# Patient Record
Sex: Female | Born: 1965 | Hispanic: Yes | Marital: Married | Smoking: Never smoker
Health system: Southern US, Community
[De-identification: ages and names within clinical notes are randomized; demographics above are authoritative.]

## PROBLEM LIST (undated history)

## (undated) DIAGNOSIS — E039 Hypothyroidism, unspecified: Secondary | ICD-10-CM

## (undated) DIAGNOSIS — R7303 Prediabetes: Secondary | ICD-10-CM

## (undated) DIAGNOSIS — L918 Other hypertrophic disorders of the skin: Secondary | ICD-10-CM

## (undated) HISTORY — PX: TUBAL LIGATION: SHX77

## (undated) HISTORY — DX: Other hypertrophic disorders of the skin: L91.8

## (undated) HISTORY — PX: VASCULAR SURGERY: SHX849

---

## 2001-01-31 HISTORY — PX: VASCULAR SURGERY: SHX849

## 2001-01-31 HISTORY — PX: VARICOSE VEIN SURGERY: SHX832

## 2004-12-28 ENCOUNTER — Ambulatory Visit: Payer: Self-pay | Admitting: Family Medicine

## 2006-09-20 ENCOUNTER — Ambulatory Visit: Payer: Self-pay | Admitting: Family Medicine

## 2007-11-20 ENCOUNTER — Ambulatory Visit: Payer: Self-pay | Admitting: Family Medicine

## 2009-05-07 ENCOUNTER — Ambulatory Visit: Payer: Self-pay | Admitting: Internal Medicine

## 2011-06-07 ENCOUNTER — Ambulatory Visit: Payer: Self-pay | Admitting: Family Medicine

## 2014-01-06 ENCOUNTER — Ambulatory Visit: Payer: Self-pay

## 2014-01-17 ENCOUNTER — Ambulatory Visit: Payer: Self-pay

## 2014-12-08 ENCOUNTER — Other Ambulatory Visit: Payer: Self-pay | Admitting: Primary Care

## 2014-12-08 DIAGNOSIS — Z0000 Encounter for general adult medical examination without abnormal findings: Secondary | ICD-10-CM

## 2015-01-28 ENCOUNTER — Encounter: Payer: Self-pay | Admitting: *Deleted

## 2015-01-28 ENCOUNTER — Ambulatory Visit: Payer: Self-pay

## 2015-01-28 ENCOUNTER — Ambulatory Visit
Admission: RE | Admit: 2015-01-28 | Discharge: 2015-01-28 | Disposition: A | Discharge status: Home / Self Care | Payer: Self-pay | Source: Ambulatory Visit | Attending: Oncology | Admitting: Oncology

## 2015-01-28 ENCOUNTER — Ambulatory Visit: Payer: Self-pay | Attending: Oncology | Admitting: *Deleted

## 2015-01-28 VITALS — BP 133/82 | HR 72 | Temp 98.0°F | Resp 18 | Ht 61.02 in | Wt 190.3 lb

## 2015-01-28 DIAGNOSIS — Z0000 Encounter for general adult medical examination without abnormal findings: Secondary | ICD-10-CM

## 2015-01-28 NOTE — Patient Instructions (Signed)
Gave patient hand-out, Women Staying Healthy, Active and Well from [facility], with education on breast health, pap smears, heart and colon health. 

## 2015-01-28 NOTE — Progress Notes (Signed)
Subjective:     Patient ID: Gabriela Lawrence, female   DOB: Nov 21, 1965, 49 y.o.   MRN: [id number]  HPI   Review of Systems     Objective:   Physical Exam  Pulmonary/Chest: Right breast exhibits no inverted nipple, no mass, no nipple discharge, no skin change and no tenderness. Left breast exhibits no inverted nipple, no mass, no nipple discharge, no skin change and no tenderness. Breasts are asymmetrical.  Left breast larger than the right       Assessment:     49 year old Hispanic [facility]  Maritza, the interpreter present during the interview and exam.  Clinical breast exam unremarkable.  Taught self breast awareness.  Patient has been screened for eligibility.  She does not have any insurance, Medicare or Medicaid.  She also meets financial eligibility.  Hand-out given on the Affordable Care Act.     Plan:     Screening mammogram ordered.  To follow-up per [facility] protocol.

## 2015-02-03 ENCOUNTER — Encounter: Payer: Self-pay | Admitting: *Deleted

## 2015-02-03 NOTE — Progress Notes (Signed)
Letter mailed to inform patient of her normal mammogram.  Next screening mammogram in one year.  [place]

## 2016-02-15 ENCOUNTER — Encounter: Payer: Self-pay | Admitting: *Deleted

## 2016-02-15 ENCOUNTER — Ambulatory Visit
Admission: RE | Admit: 2016-02-15 | Discharge: 2016-02-15 | Disposition: A | Discharge status: Home / Self Care | Payer: Self-pay | Source: Ambulatory Visit | Attending: Oncology | Admitting: Oncology

## 2016-02-15 ENCOUNTER — Ambulatory Visit: Payer: Self-pay | Attending: Oncology | Admitting: *Deleted

## 2016-02-15 VITALS — BP 125/78 | HR 59 | Temp 97.3°F | Resp 18 | Ht 60.63 in | Wt 191.9 lb

## 2016-02-15 DIAGNOSIS — Z0000 Encounter for general adult medical examination without abnormal findings: Secondary | ICD-10-CM

## 2016-02-15 NOTE — Patient Instructions (Signed)
Gave patient hand-out, Women Staying Healthy, Active and Well from [facility], with education on breast health, pap smears, heart and colon health. 

## 2016-02-15 NOTE — Progress Notes (Signed)
Subjective:     Patient ID: Gabriela Lawrence, female   DOB: 1965/10/13, 51 y.o.   MRN: [id number]  HPI   Review of Systems     Objective:   Physical Exam  Pulmonary/Chest: Right breast exhibits no inverted nipple, no mass, no nipple discharge, no skin change and no tenderness. Left breast exhibits no inverted nipple, no mass, no nipple discharge, no skin change and no tenderness. Breasts are symmetrical.       Assessment:     51 year old Hispanic [facility].  Gabriela Lawrence, the interpreter present during the interveiw and exam.  Clinical breast exam unremarkable.  Taught self breast awareness.  Patient has been screened for eligibility.  She does not have any insurance, Medicare or Medicaid.  She also meets financial eligibility.  Hand-out given on the Affordable Care Act.    Plan:     Screening mammogram ordered.  Will follow-up per [facility] protocol.

## 2016-02-15 NOTE — Progress Notes (Signed)
Letter mailed to inform patient of her normal mammogram.  She is to follow-up in one year with annual screening.  HSIS to [place]. 

## 2018-11-26 ENCOUNTER — Telehealth: Payer: Self-pay

## 2018-11-27 ENCOUNTER — Ambulatory Visit: Payer: Self-pay | Attending: Oncology | Admitting: *Deleted

## 2018-11-27 ENCOUNTER — Other Ambulatory Visit: Payer: Self-pay

## 2018-11-27 ENCOUNTER — Encounter: Payer: Self-pay | Admitting: *Deleted

## 2018-11-27 ENCOUNTER — Ambulatory Visit
Admission: RE | Admit: 2018-11-27 | Discharge: 2018-11-27 | Disposition: A | Discharge status: Home / Self Care | Payer: Self-pay | Source: Ambulatory Visit | Attending: Oncology | Admitting: Oncology

## 2018-11-27 VITALS — BP 116/68 | HR 71 | Temp 97.6°F | Ht 61.0 in | Wt 199.0 lb

## 2018-11-27 DIAGNOSIS — Z0000 Encounter for general adult medical examination without abnormal findings: Secondary | ICD-10-CM

## 2018-11-27 NOTE — Progress Notes (Signed)
  Subjective:     Patient ID: Gabriela Lawrence, female   DOB: Feb 08, 1965, 53 y.o.   MRN: [id number]  HPI   Review of Systems     Objective:   Physical Exam Chest:     Breasts:        Right: No swelling, bleeding, inverted nipple, mass, nipple discharge, skin change or tenderness.        Left: No swelling, bleeding, inverted nipple, mass, nipple discharge, skin change or tenderness.  Lymphadenopathy:     Upper Body:     Right upper body: No supraclavicular or axillary adenopathy.     Left upper body: No supraclavicular or axillary adenopathy.        Assessment:     53 year old Hispanic female present to [facility] for clinical breast exam and mammogram.  Lloyda, the interpreter present during the interview and exam. Clinical breast exam unremarkable.  Taught self breast awareness.  Tyrer-Cuzick breast cancer risk assessment with a lifetime risk of 14.1%.  Per NCCN guidelines no further imagaing or genetic testing is recommended.  Patient has been screened for eligibility.  She does not have any insurance, Medicare or Medicaid.  She also meets financial eligibility.  Hand-out given on the Affordable Care Act.    Plan:     Screening mammogram ordered.  Will follow up per [facility] protocol.

## 2018-11-27 NOTE — Patient Instructions (Signed)
Gave patient hand-out, Women Staying Healthy, Active and Well from [facility], with education on breast health, pap smears, heart and colon health. 

## 2018-11-28 ENCOUNTER — Encounter: Payer: Self-pay | Admitting: *Deleted

## 2018-11-28 NOTE — Progress Notes (Signed)
Letter mailed from [facility] to inform patient of her normal mammogram results.  Patient is to follow-up with annual screening in one year.  HSIS to [place]. 

## 2018-11-29 ENCOUNTER — Encounter: Payer: Self-pay | Admitting: *Deleted

## 2019-11-11 ENCOUNTER — Emergency Department: Payer: Self-pay

## 2019-11-11 ENCOUNTER — Other Ambulatory Visit: Payer: Self-pay

## 2019-11-11 DIAGNOSIS — K432 Incisional hernia without obstruction or gangrene: Secondary | ICD-10-CM | POA: Insufficient documentation

## 2019-11-11 DIAGNOSIS — Z20822 Contact with and (suspected) exposure to covid-19: Secondary | ICD-10-CM | POA: Insufficient documentation

## 2019-11-11 DIAGNOSIS — K353 Acute appendicitis with localized peritonitis, without perforation or gangrene: Principal | ICD-10-CM | POA: Insufficient documentation

## 2019-11-11 DIAGNOSIS — Z23 Encounter for immunization: Secondary | ICD-10-CM | POA: Insufficient documentation

## 2019-11-11 DIAGNOSIS — R911 Solitary pulmonary nodule: Secondary | ICD-10-CM | POA: Insufficient documentation

## 2019-11-11 LAB — CBC
HCT: 41.6 % (ref 36.0–46.0)
Hemoglobin: 14 g/dL (ref 12.0–15.0)
MCH: 30.2 pg (ref 26.0–34.0)
MCHC: 33.7 g/dL (ref 30.0–36.0)
MCV: 89.8 fL (ref 80.0–100.0)
Platelets: 234 10*3/uL (ref 150–400)
RBC: 4.63 MIL/uL (ref 3.87–5.11)
RDW: 12.6 % (ref 11.5–15.5)
WBC: 11.2 10*3/uL — ABNORMAL HIGH (ref 4.0–10.5)
nRBC: 0 % (ref 0.0–0.2)

## 2019-11-11 LAB — COMPREHENSIVE METABOLIC PANEL
ALT: 29 U/L (ref 0–44)
AST: 25 U/L (ref 15–41)
Albumin: 4.5 g/dL (ref 3.5–5.0)
Alkaline Phosphatase: 80 U/L (ref 38–126)
Anion gap: 11 (ref 5–15)
BUN: 19 mg/dL (ref 6–20)
CO2: 23 mmol/L (ref 22–32)
Calcium: 9.5 mg/dL (ref 8.9–10.3)
Chloride: 101 mmol/L (ref 98–111)
Creatinine, Ser: 0.6 mg/dL (ref 0.44–1.00)
GFR, Estimated: >60 mL/min (ref >60)
Glucose, Bld: 122 mg/dL — ABNORMAL HIGH (ref 70–99)
Potassium: 4.1 mmol/L (ref 3.5–5.1)
Sodium: 135 mmol/L (ref 135–145)
Total Bilirubin: 0.9 mg/dL (ref 0.3–1.2)
Total Protein: 7.7 g/dL (ref 6.5–8.1)

## 2019-11-11 LAB — URINALYSIS, COMPLETE (UACMP) WITH MICROSCOPIC
Bilirubin Urine: NEGATIVE
Glucose, UA: NEGATIVE mg/dL
Ketones, ur: NEGATIVE mg/dL
Nitrite: NEGATIVE
Protein, ur: NEGATIVE mg/dL
Specific Gravity, Urine: 1.019 (ref 1.005–1.030)
pH: 6 (ref 5.0–8.0)

## 2019-11-11 LAB — LIPASE, BLOOD: Lipase: 31 U/L (ref 11–51)

## 2019-11-11 LAB — PROTIME-INR
INR: 0.9 (ref 0.8–1.2)
Prothrombin Time: 11.9 seconds (ref 11.4–15.2)

## 2019-11-11 LAB — APTT: aPTT: 30 seconds (ref 24–36)

## 2019-11-11 LAB — POCT PREGNANCY, URINE: Preg Test, Ur: NEGATIVE

## 2019-11-11 NOTE — ED Notes (Addendum)
PT state L arm weakness, not pain. Mild drift noted upon exam. Lower extremities WDL, face symmetric.  PT also states hx of umbilical hernia

## 2019-11-11 NOTE — ED Triage Notes (Addendum)
PT to ED via POV from home c/o sharp abd pain (epigastric to umbilicus) since 1430 as well as  Arm pain (L), nausea, SHOB. No diarrhea or fevers. PT mildly diaphoretic  Triage using video interpretor

## 2019-11-12 ENCOUNTER — Emergency Department: Payer: Self-pay

## 2019-11-12 ENCOUNTER — Observation Stay: Payer: Self-pay | Admitting: Registered Nurse

## 2019-11-12 ENCOUNTER — Observation Stay
Admission: EM | Admit: 2019-11-12 | Discharge: 2019-11-13 | Disposition: A | Discharge status: Home / Self Care | Payer: Self-pay | Attending: Surgery | Admitting: Surgery

## 2019-11-12 ENCOUNTER — Encounter
Admission: EM | Disposition: A | Discharge status: Home / Self Care | Payer: Self-pay | Source: Home / Self Care | Attending: Emergency Medicine

## 2019-11-12 DIAGNOSIS — K353 Acute appendicitis with localized peritonitis, without perforation or gangrene: Secondary | ICD-10-CM

## 2019-11-12 DIAGNOSIS — K37 Unspecified appendicitis: Secondary | ICD-10-CM

## 2019-11-12 DIAGNOSIS — R911 Solitary pulmonary nodule: Secondary | ICD-10-CM

## 2019-11-12 DIAGNOSIS — K432 Incisional hernia without obstruction or gangrene: Secondary | ICD-10-CM

## 2019-11-12 DIAGNOSIS — K3580 Unspecified acute appendicitis: Secondary | ICD-10-CM | POA: Diagnosis present

## 2019-11-12 HISTORY — PX: LAPAROSCOPIC APPENDECTOMY: SHX408

## 2019-11-12 HISTORY — DX: Unspecified appendicitis: K37

## 2019-11-12 LAB — RESPIRATORY PANEL BY RT PCR (FLU A&B, COVID)
Influenza A by PCR: NEGATIVE
Influenza B by PCR: NEGATIVE
SARS Coronavirus 2 by RT PCR: NEGATIVE

## 2019-11-12 SURGERY — APPENDECTOMY, LAPAROSCOPIC
Anesthesia: General

## 2019-11-12 MED ORDER — LACTATED RINGERS IV SOLN: INTRAVENOUS | Status: DC | PRN

## 2019-11-12 MED ORDER — FENTANYL CITRATE (PF) 100 MCG/2ML IJ SOLN: 25.0000 ug | INTRAMUSCULAR | Status: DC | PRN

## 2019-11-12 MED ORDER — MORPHINE SULFATE (PF) 2 MG/ML IV SOLN
2.0000 mg | INTRAVENOUS | Status: DC | PRN
  Administered 2019-11-12: 2 mg via INTRAVENOUS
  Filled 2019-11-12: qty 1

## 2019-11-12 MED ORDER — OXYCODONE HCL 5 MG PO TABS: 5.0000 mg | ORAL_TABLET | Freq: Once | ORAL | Status: DC | PRN

## 2019-11-12 MED ORDER — SUCCINYLCHOLINE CHLORIDE 20 MG/ML IJ SOLN
INTRAMUSCULAR | Status: DC | PRN
  Administered 2019-11-12: 100 mg via INTRAVENOUS

## 2019-11-12 MED ORDER — INFLUENZA VAC SPLIT QUAD 0.5 ML IM SUSY
0.5000 mL | PREFILLED_SYRINGE | INTRAMUSCULAR | Status: AC
  Administered 2019-11-13: 0.5 mL via INTRAMUSCULAR
  Filled 2019-11-12: qty 0.5

## 2019-11-12 MED ORDER — FENTANYL CITRATE (PF) 100 MCG/2ML IJ SOLN
INTRAMUSCULAR | Status: DC | PRN
  Administered 2019-11-12 (×2): 25 ug via INTRAVENOUS
  Administered 2019-11-12: 50 ug via INTRAVENOUS

## 2019-11-12 MED ORDER — LIDOCAINE HCL (CARDIAC) PF 100 MG/5ML IV SOSY
PREFILLED_SYRINGE | INTRAVENOUS | Status: DC | PRN
  Administered 2019-11-12: 100 mg via INTRAVENOUS

## 2019-11-12 MED ORDER — ONDANSETRON HCL 4 MG/2ML IJ SOLN
INTRAMUSCULAR | Status: AC
  Filled 2019-11-12: qty 2

## 2019-11-12 MED ORDER — IOHEXOL 300 MG/ML  SOLN
100.0000 mL | Freq: Once | INTRAMUSCULAR | Status: AC | PRN
  Administered 2019-11-12: 100 mL via INTRAVENOUS

## 2019-11-12 MED ORDER — KETOROLAC TROMETHAMINE 30 MG/ML IJ SOLN
INTRAMUSCULAR | Status: DC | PRN
  Administered 2019-11-12: 30 mg via INTRAVENOUS

## 2019-11-12 MED ORDER — FENTANYL CITRATE (PF) 100 MCG/2ML IJ SOLN
50.0000 ug | Freq: Once | INTRAMUSCULAR | Status: AC
  Administered 2019-11-12: 50 ug via INTRAVENOUS
  Filled 2019-11-12: qty 2

## 2019-11-12 MED ORDER — PROMETHAZINE HCL 25 MG/ML IJ SOLN: 6.2500 mg | INTRAMUSCULAR | Status: DC | PRN

## 2019-11-12 MED ORDER — SODIUM CHLORIDE 0.9 % IV SOLN: INTRAVENOUS | Status: DC

## 2019-11-12 MED ORDER — ACETAMINOPHEN 10 MG/ML IV SOLN
INTRAVENOUS | Status: AC
  Filled 2019-11-12: qty 100

## 2019-11-12 MED ORDER — LACTATED RINGERS IV BOLUS
1000.0000 mL | Freq: Once | INTRAVENOUS | Status: AC
  Administered 2019-11-12: 1000 mL via INTRAVENOUS

## 2019-11-12 MED ORDER — ONDANSETRON HCL 4 MG/2ML IJ SOLN
4.0000 mg | Freq: Once | INTRAMUSCULAR | Status: AC
  Administered 2019-11-12: 4 mg via INTRAVENOUS
  Filled 2019-11-12: qty 2

## 2019-11-12 MED ORDER — OXYCODONE HCL 5 MG/5ML PO SOLN: 5.0000 mg | Freq: Once | ORAL | Status: DC | PRN

## 2019-11-12 MED ORDER — ONDANSETRON HCL 4 MG/2ML IJ SOLN
INTRAMUSCULAR | Status: DC | PRN
  Administered 2019-11-12: 4 mg via INTRAVENOUS

## 2019-11-12 MED ORDER — KETAMINE HCL 10 MG/ML IJ SOLN
INTRAMUSCULAR | Status: DC | PRN
  Administered 2019-11-12 (×2): 10 mg via INTRAVENOUS
  Administered 2019-11-12: 20 mg via INTRAVENOUS

## 2019-11-12 MED ORDER — KETOROLAC TROMETHAMINE 30 MG/ML IJ SOLN
INTRAMUSCULAR | Status: AC
  Filled 2019-11-12: qty 1

## 2019-11-12 MED ORDER — MIDAZOLAM HCL 2 MG/2ML IJ SOLN
INTRAMUSCULAR | Status: DC | PRN
  Administered 2019-11-12: 2 mg via INTRAVENOUS

## 2019-11-12 MED ORDER — BUPIVACAINE-EPINEPHRINE (PF) 0.5% -1:200000 IJ SOLN
INTRAMUSCULAR | Status: AC
  Filled 2019-11-12: qty 90

## 2019-11-12 MED ORDER — FENTANYL CITRATE (PF) 100 MCG/2ML IJ SOLN
INTRAMUSCULAR | Status: AC
  Filled 2019-11-12: qty 2

## 2019-11-12 MED ORDER — PHENYLEPHRINE HCL (PRESSORS) 10 MG/ML IV SOLN
INTRAVENOUS | Status: DC | PRN
  Administered 2019-11-12 (×2): 100 ug via INTRAVENOUS

## 2019-11-12 MED ORDER — BUPIVACAINE-EPINEPHRINE (PF) 0.5% -1:200000 IJ SOLN
INTRAMUSCULAR | Status: DC | PRN
  Administered 2019-11-12: 30 mL

## 2019-11-12 MED ORDER — OXYCODONE HCL 5 MG PO TABS: 5.0000 mg | ORAL_TABLET | ORAL | Status: DC | PRN

## 2019-11-12 MED ORDER — ONDANSETRON 4 MG PO TBDP: 4.0000 mg | ORAL_TABLET | Freq: Four times a day (QID) | ORAL | Status: DC | PRN

## 2019-11-12 MED ORDER — PROPOFOL 10 MG/ML IV BOLUS
INTRAVENOUS | Status: AC
  Filled 2019-11-12: qty 20

## 2019-11-12 MED ORDER — ROCURONIUM BROMIDE 100 MG/10ML IV SOLN
INTRAVENOUS | Status: DC | PRN
  Administered 2019-11-12: 40 mg via INTRAVENOUS
  Administered 2019-11-12: 10 mg via INTRAVENOUS

## 2019-11-12 MED ORDER — DEXAMETHASONE SODIUM PHOSPHATE 10 MG/ML IJ SOLN
INTRAMUSCULAR | Status: DC | PRN
  Administered 2019-11-12: 6 mg via INTRAVENOUS

## 2019-11-12 MED ORDER — MIDAZOLAM HCL 2 MG/2ML IJ SOLN
INTRAMUSCULAR | Status: AC
  Filled 2019-11-12: qty 2

## 2019-11-12 MED ORDER — MEPERIDINE HCL 50 MG/ML IJ SOLN: 6.2500 mg | INTRAMUSCULAR | Status: DC | PRN

## 2019-11-12 MED ORDER — PIPERACILLIN-TAZOBACTAM 3.375 G IVPB
3.3750 g | Freq: Three times a day (TID) | INTRAVENOUS | Status: DC
  Administered 2019-11-12 – 2019-11-13 (×3): 3.375 g via INTRAVENOUS
  Filled 2019-11-12 (×3): qty 50

## 2019-11-12 MED ORDER — SUGAMMADEX SODIUM 200 MG/2ML IV SOLN
INTRAVENOUS | Status: DC | PRN
  Administered 2019-11-12: 200 mg via INTRAVENOUS

## 2019-11-12 MED ORDER — SODIUM CHLORIDE (PF) 0.9 % IJ SOLN
INTRAMUSCULAR | Status: AC
  Filled 2019-11-12: qty 10

## 2019-11-12 MED ORDER — KETOROLAC TROMETHAMINE 30 MG/ML IJ SOLN
30.0000 mg | Freq: Four times a day (QID) | INTRAMUSCULAR | Status: DC
  Administered 2019-11-12 – 2019-11-13 (×4): 30 mg via INTRAVENOUS
  Filled 2019-11-12 (×4): qty 1

## 2019-11-12 MED ORDER — ONDANSETRON HCL 4 MG/2ML IJ SOLN
4.0000 mg | Freq: Four times a day (QID) | INTRAMUSCULAR | Status: DC | PRN
  Administered 2019-11-12: 4 mg via INTRAVENOUS
  Filled 2019-11-12: qty 2

## 2019-11-12 MED ORDER — HYDROMORPHONE HCL 1 MG/ML IJ SOLN: 0.5000 mg | INTRAMUSCULAR | Status: DC | PRN

## 2019-11-12 MED ORDER — PROPOFOL 10 MG/ML IV BOLUS
INTRAVENOUS | Status: DC | PRN
  Administered 2019-11-12: 160 mg via INTRAVENOUS

## 2019-11-12 MED ORDER — ACETAMINOPHEN 500 MG PO TABS
1000.0000 mg | ORAL_TABLET | Freq: Four times a day (QID) | ORAL | Status: DC
  Administered 2019-11-12 – 2019-11-13 (×5): 1000 mg via ORAL
  Filled 2019-11-12 (×5): qty 2

## 2019-11-12 MED ORDER — DEXAMETHASONE SODIUM PHOSPHATE 10 MG/ML IJ SOLN
INTRAMUSCULAR | Status: AC
  Filled 2019-11-12: qty 1

## 2019-11-12 SURGICAL SUPPLY — 39 items
CANISTER SUCT 1200ML W/VALVE (MISCELLANEOUS) ×3 IMPLANT
CHLORAPREP W/TINT 26 (MISCELLANEOUS) ×3 IMPLANT
COVER WAND RF STERILE (DRAPES) ×3 IMPLANT
CUTTER FLEX LINEAR 45M (STAPLE) ×3 IMPLANT
DERMABOND ADVANCED (GAUZE/BANDAGES/DRESSINGS) ×2
DERMABOND ADVANCED .7 DNX12 (GAUZE/BANDAGES/DRESSINGS) ×1 IMPLANT
ELECT CAUTERY BLADE 6.4 (BLADE) ×3 IMPLANT
ELECT REM PT RETURN 9FT ADLT (ELECTROSURGICAL) ×3
ELECTRODE REM PT RTRN 9FT ADLT (ELECTROSURGICAL) ×1 IMPLANT
GLOVE SURG SYN 7.0 (GLOVE) ×12 IMPLANT
GLOVE SURG SYN 7.5  E (GLOVE) ×8
GLOVE SURG SYN 7.5 E (GLOVE) ×4 IMPLANT
GOWN STRL REUS W/ TWL LRG LVL3 (GOWN DISPOSABLE) ×2 IMPLANT
GOWN STRL REUS W/TWL LRG LVL3 (GOWN DISPOSABLE) ×4
IRRIGATION STRYKERFLOW (MISCELLANEOUS) IMPLANT
IRRIGATOR STRYKERFLOW (MISCELLANEOUS)
IV NS 1000ML (IV SOLUTION) ×2
IV NS 1000ML BAXH (IV SOLUTION) ×1 IMPLANT
KIT TURNOVER KIT A (KITS) ×3 IMPLANT
LABEL OR SOLS (LABEL) ×3 IMPLANT
LIGASURE LAP MARYLAND 5MM 37CM (ELECTROSURGICAL) ×3 IMPLANT
NEEDLE HYPO 22GX1.5 SAFETY (NEEDLE) ×3 IMPLANT
NS IRRIG 500ML POUR BTL (IV SOLUTION) ×3 IMPLANT
PACK LAP CHOLECYSTECTOMY (MISCELLANEOUS) ×3 IMPLANT
PENCIL ELECTRO HAND CTR (MISCELLANEOUS) ×3 IMPLANT
POUCH SPECIMEN RETRIEVAL 10MM (ENDOMECHANICALS) ×3 IMPLANT
RELOAD 45 VASCULAR/THIN (ENDOMECHANICALS) IMPLANT
RELOAD STAPLE TA45 3.5 REG BLU (ENDOMECHANICALS) ×3 IMPLANT
SCISSORS METZENBAUM CVD 33 (INSTRUMENTS) ×3 IMPLANT
SLEEVE ADV FIXATION 5X100MM (TROCAR) ×3 IMPLANT
SUT MNCRL 4-0 (SUTURE) ×2
SUT MNCRL 4-0 27XMFL (SUTURE) ×1
SUT VICRYL 0 AB UR-6 (SUTURE) ×3 IMPLANT
SUTURE MNCRL 4-0 27XMF (SUTURE) ×1 IMPLANT
SYS KII FIOS ACCESS ABD 5X100 (TROCAR) ×3
SYSTEM KII FIOS ACES ABD 5X100 (TROCAR) ×1 IMPLANT
TRAY FOLEY MTR SLVR 16FR STAT (SET/KITS/TRAYS/PACK) ×3 IMPLANT
TROCAR BALLN GELPORT 12X130M (ENDOMECHANICALS) ×3 IMPLANT
TUBING EVAC SMOKE HEATED PNEUM (TUBING) ×3 IMPLANT

## 2019-11-12 NOTE — Anesthesia Preprocedure Evaluation (Signed)
Anesthesia Evaluation  Patient identified by MRN, date of birth, ID band Patient awake    Reviewed: Allergy & Precautions, NPO status , Patient's Chart, lab work & pertinent test results  History of Anesthesia Complications Negative for: history of anesthetic complications  Airway Mallampati: IV  TM Distance: >3 FB Neck ROM: Full    Dental no notable dental hx.    Pulmonary neg pulmonary ROS, neg sleep apnea, neg COPD,    breath sounds clear to auscultation- rhonchi (-) wheezing      Cardiovascular Exercise Tolerance: Good (-) hypertension(-) CAD and (-) Past MI  Rhythm:Regular Rate:Normal - Systolic murmurs and - Diastolic murmurs    Neuro/Psych negative neurological ROS  negative psych ROS   GI/Hepatic negative GI ROS, Neg liver ROS,   Endo/Other  negative endocrine ROSneg diabetes  Renal/GU negative Renal ROS     Musculoskeletal negative musculoskeletal ROS (+)   Abdominal (+) + obese,   Peds  Hematology negative hematology ROS (+)   Anesthesia Other Findings   Reproductive/Obstetrics                             Anesthesia Physical Anesthesia Plan  ASA: II and emergent  Anesthesia Plan: General   Post-op Pain Management:    Induction: Intravenous  PONV Risk Score and Plan: 2 and Ondansetron, Dexamethasone and Midazolam  Airway Management Planned: Oral ETT  Additional Equipment:   Intra-op Plan:   Post-operative Plan: Extubation in OR  Informed Consent: I have reviewed the patients History and Physical, chart, labs and discussed the procedure including the risks, benefits and alternatives for the proposed anesthesia with the patient or authorized representative who has indicated his/her understanding and acceptance.     Dental advisory given  Plan Discussed with: CRNA and Anesthesiologist  Anesthesia Plan Comments:         Anesthesia Quick Evaluation

## 2019-11-12 NOTE — Anesthesia Procedure Notes (Signed)
Procedure Name: Intubation Date/Time: 11/12/2019 2:48 PM Performed by: Lynden Oxford, CRNA Pre-anesthesia Checklist: Patient identified, Emergency Drugs available, Suction available and Patient being monitored Patient Re-evaluated:Patient Re-evaluated prior to induction Oxygen Delivery Method: Circle system utilized Preoxygenation: Pre-oxygenation with 100% oxygen Induction Type: IV induction, Rapid sequence and Cricoid Pressure applied Laryngoscope Size: McGraph and 3 Grade View: Grade I Tube type: Oral Tube size: 7.0 mm Number of attempts: 1 Airway Equipment and Method: Stylet,  Oral airway and Video-laryngoscopy Placement Confirmation: ETT inserted through vocal cords under direct vision,  positive ETCO2 and breath sounds checked- equal and bilateral Secured at: 21 cm Tube secured with: Tape Dental Injury: Teeth and Oropharynx as per pre-operative assessment

## 2019-11-12 NOTE — ED Notes (Signed)
This tech explained to patient's husband that he is not allow to stay in the waiting area with the patient due to covid restriction. Patient's husband replied that, " she has been here since last night and that she is in pain. And I am just here to check on her. " This tech okayed for the husband to check on her but that he could not stay here longer than 5 minutes. This tech also apologized for the long wait and informed them that we are trying our best for her to go to a room and to be seen by a provider. Victorino Dike, RN will be notified.

## 2019-11-12 NOTE — ED Provider Notes (Signed)
[facility] Emergency Department Provider Note  ____________________________________________   First MD Initiated Contact with Patient 11/12/19 [phone]     (approximate)  I have reviewed the triage vital signs and the nursing notes.   HISTORY  Chief Complaint Abdominal Pain   HPI Gabriela Lawrence is a 54 y.o. female with past medical history of umbilical hernia and prior C-section who presents for assessment of generalized abdominal pain associate with some nonbloody nonbilious emesis that began yesterday.  Patient states this is associate with mild headache and some weakness in the left side of her body.  No personal episodes.  No clear alleviating aggravating factors.  Patient denies any vision changes, chest pain, cough, shortness of breath, urinary symptoms, diarrhea, constipation, blood in her stool, in her urine, back pain, rash, extremity pain or numbness, or other acute complaints.  Denies any other significant past medical history.  Does not take any daily medicines.  Denies EtOH or illicit drug use.         History reviewed. No pertinent past medical history.  There are no problems to display for this patient.   Past Surgical History:  Procedure Laterality Date  . VASCULAR SURGERY      Prior to Admission medications   Not on File    Allergies Patient has no known allergies.  Family History  Problem Relation Age of Onset  . Breast cancer Maternal Aunt 71    Social History Social History   Tobacco Use  . Smoking status: Never Smoker  . Smokeless tobacco: Never Used  Substance Use Topics  . Alcohol use: Never  . Drug use: Not on file    Review of Systems  Review of Systems  Constitutional: Negative for chills and fever.  HENT: Negative for sore throat.   Eyes: Negative for pain.  Respiratory: Negative for cough and stridor.   Cardiovascular: Negative for chest pain.  Gastrointestinal: Positive for abdominal pain,  nausea and vomiting. Negative for blood in stool and melena.  Genitourinary: Negative for dysuria.  Musculoskeletal: Negative for myalgias.  Skin: Negative for rash.  Neurological: Positive for weakness and headaches. Negative for seizures and loss of consciousness.  Psychiatric/Behavioral: Negative for suicidal ideas.  All other systems reviewed and are negative.     ____________________________________________   PHYSICAL EXAM:  VITAL SIGNS: ED Triage Vitals  Enc Vitals Group     BP 11/11/19 2222 (!) 166/113     Pulse Rate 11/11/19 2222 96     Resp 11/11/19 2222 20     Temp 11/12/19 0826 99 F (37.2 C)     Temp Source 11/12/19 0826 Oral     SpO2 11/11/19 2222 97 %     Weight 11/11/19 2223 190 lb (86.2 kg)     Height 11/11/19 2223 5' 1.02" (1.55 m)     Head Circumference --      Peak Flow --      Pain Score 11/11/19 2220 10     Pain Loc --      Pain Edu? --      Excl. in GC? --    Vitals:   11/12/19 0527 11/12/19 0826  BP: 102/80 (!) 146/80  Pulse: 98 94  Resp: 20 20  Temp:  99 F (37.2 C)  SpO2: 97% 99%   Physical Exam Vitals and nursing note reviewed.  Constitutional:      General: She is not in acute distress.    Appearance: She is well-developed.  HENT:  Head: Normocephalic and atraumatic.     Right Ear: External ear normal.     Left Ear: External ear normal.     Nose: Nose normal.     Mouth/Throat:     Mouth: Mucous membranes are moist.  Eyes:     Conjunctiva/sclera: Conjunctivae normal.  Cardiovascular:     Rate and Rhythm: Normal rate and regular rhythm.     Pulses: Normal pulses.     Heart sounds: No murmur heard.   Pulmonary:     Effort: Pulmonary effort is normal. No respiratory distress.     Breath sounds: Normal breath sounds.  Abdominal:     Palpations: Abdomen is soft.     Tenderness: There is generalized abdominal tenderness and tenderness in the right lower quadrant and suprapubic area. There is no right CVA tenderness or left CVA  tenderness.  Musculoskeletal:     Cervical back: Neck supple.  Skin:    General: Skin is warm and dry.     Capillary Refill: Capillary refill takes less than 2 seconds.  Neurological:     Mental Status: She is alert and oriented to person, place, and time.  Psychiatric:        Mood and Affect: Mood normal.     No pronator drift.  No finger dysmetria.  Cranial nerves II through XII grossly intact.  Patient has full and symmetric strength of all extremities.  Sensation is intact to light touch of the lower extremities. ____________________________________________   LABS (all labs ordered are listed, but only abnormal results are displayed)  Labs Reviewed  COMPREHENSIVE METABOLIC PANEL - Abnormal; Notable for the following components:      Result Value   Glucose, Bld 122 (*)    All other components within normal limits  CBC - Abnormal; Notable for the following components:   WBC 11.2 (*)    All other components within normal limits  URINALYSIS, COMPLETE (UACMP) WITH MICROSCOPIC - Abnormal; Notable for the following components:   Color, Urine YELLOW (*)    APPearance CLEAR (*)    Hgb urine dipstick MODERATE (*)    Leukocytes,Ua SMALL (*)    Bacteria, UA RARE (*)    All other components within normal limits  RESPIRATORY PANEL BY RT PCR (FLU A&B, COVID)  LIPASE, BLOOD  PROTIME-INR  APTT  POC URINE PREG, ED  POCT PREGNANCY, URINE   ____________________________________________  EKG  Sinus rhythm with a ventricular rate of 88, normal axis, unremarkable intervals, no clear evidence of acute ischemia although there is a nonspecific ST change in lead III. ____________________________________________  RADIOLOGY  CT abdomen pelvis interpreted by myself shows no evidence of SBO, diverticulitis, pyelonephritis but did show evidence of acute uncomplicated appendicitis.  CT head shows evidence of intracranial bleeding or other acute intracranial abnormality.  Official radiology  report(s): CT HEAD WO CONTRAST  Result Date: 11/11/2019 CLINICAL DATA:  Neuro deficit, acute, stroke suspected L arm weakness EXAM: CT HEAD WITHOUT CONTRAST TECHNIQUE: Contiguous axial images were obtained from the base of the skull through the vertex without intravenous contrast. COMPARISON:  None. FINDINGS: Brain: No evidence of large-territorial acute infarction. No parenchymal hemorrhage. No mass lesion. No extra-axial collection. No mass effect or midline shift. No hydrocephalus. Basilar cisterns are patent. Vascular: No hyperdense vessel. Skull: No acute fracture or focal lesion. Sinuses/Orbits: Paranasal sinuses and mastoid air cells are clear. The orbits are unremarkable. Other: None. IMPRESSION: No acute intracranial abnormality. Electronically Signed   By: Normajean GlasgowMorgane  Naveau M.D.  On: 11/11/2019 22:52   CT ABDOMEN PELVIS W CONTRAST  Result Date: 11/12/2019 CLINICAL DATA:  Abdominal pain with nausea EXAM: CT ABDOMEN AND PELVIS WITH CONTRAST TECHNIQUE: Multidetector CT imaging of the abdomen and pelvis was performed using the standard protocol following bolus administration of intravenous contrast. CONTRAST:  OMNIPAQUE IOHEXOL 300 MG/ML  SOLN COMPARISON:  None. FINDINGS: Lower chest: There is bibasilar atelectasis. There is a nodular opacity in the anterior left base measuring 0.9 x 0.4 cm seen on axial slice 6 series 7. Hepatobiliary: There is hepatic steatosis with fatty sparing near the gallbladder fossa region. No focal liver lesions are appreciable. The gallbladder wall is not appreciably thickened. There is no biliary duct dilatation. Pancreas: There is no pancreatic mass or inflammatory focus. Spleen: No splenic lesions are evident. Adrenals/Urinary Tract: Adrenals bilaterally appear normal. Kidneys bilaterally show no evident mass or hydronephrosis on either side. There is no appreciable renal or ureteral calculus on either side. Urinary bladder is midline with wall thickness within  normal limits. Stomach/Bowel: There is moderate stool in the colon. There is no appreciable bowel wall thickening. No bowel obstruction. The terminal ileum appears normal. No free air or portal venous air is evident. Vascular/Lymphatic: There is no abdominal aortic aneurysm. No appreciable arterial vascular lesions are evident. Major venous structures appear patent. There is an apparent lymph node slightly superior to the umbilicus anteriorly measuring 1.5 x 1.4 cm. There is a lymph node in the right inguinal region measuring 2.1 x 1.3 cm. Several smaller inguinal lymph nodes are noted on the right as well. No other adenopathy evident. Reproductive: The uterus is anteverted.  No adnexal mass. Other: The appendix measures 1.6 cm in diameter. There is moderate periappendiceal region soft tissue stranding and mild fluid. There are several appendicoliths within the appendix with an appendicular lith at the base of the appendix measuring 8 x 8 mm. There is no abscess or evidence of perforation. No abscess or ascites is evident in the abdomen or pelvis. There is mild fat in the umbilicus. Musculoskeletal: There is degenerative change in the lumbar spine. No blastic or lytic bone lesions are evident. No intramuscular lesions are apparent. IMPRESSION: 1.  Findings indicative of acute appendiceal inflammation. Appendix: Location: Appendix arises from the cecum extending inferomedially, located in the mid pelvic region to the right of midline. Diameter: 1.6 cm Appendicolith: Several, largest measuring 8 x 8 mm Mucosal hyper-enhancement: Equivocal Extraluminal gas: None Periappendiceal collection: Soft tissue stranding along the course of the appendix and nearby fat structures noted. Slight fluid noted without well-defined abscess. 2. Right inguinal adenopathy as well as enlarged lymph nodes slightly superior to the umbilicus of uncertain etiology. No other adenopathy. 3. No abscess in the abdomen or pelvis. No bowel  obstruction. No free air. 4. Hepatic steatosis with fatty sparing noted near the gallbladder fossa. 5. No renal or ureteral calculus. No hydronephrosis. Urinary bladder wall thickness normal. 6. 9 x 4 mm nodular opacity anterior left base. Non-contrast chest CT at 6-12 months is recommended. If the nodule is stable at time of repeat CT, then future CT at 18-24 months (from today's scan) is considered optional for low-risk patients, but is recommended for high-risk patients. This recommendation follows the consensus statement: Guidelines for Management of Incidental Pulmonary Nodules Detected on CT Images: From the Fleischner Society 2017; Radiology 2017; 284:228-243. Critical Value/emergent results were called by telephone at the time of interpretation on 11/12/2019 at 10:00 am to provider [facility] , who verbally acknowledged  these results. Electronically Signed   By: Bretta Bang III M.D.   On: 11/12/2019 10:01    ____________________________________________   PROCEDURES  Procedure(s) performed (including Critical Care):  .1-3 Lead EKG Interpretation Performed by: Gilles Chiquito, MD Authorized by: Gilles Chiquito, MD     Interpretation: normal     ECG rate assessment: normal     Rhythm: sinus rhythm     Ectopy: none     Conduction: normal       ____________________________________________   INITIAL IMPRESSION / ASSESSMENT AND PLAN / ED COURSE        Patient presents for assessment of some abdominal pain that is generalized for use of nonbloody nonbilious emesis, headache, and reported left-sided weakness that began yesterday.  States she thinks it began shortly after noon yesterday.  Patient is slightly hypertensive with a BP of 166/113 with otherwise stable vital signs on arrival.  Exam as above remarkable for no evidence of any focal neurological deficits and specifically no pronator drift although this was apparently seen in triage.  Patient was also noted to ambulate  with steady gait.  With regard to patient's abdominal pain differential includes but is not limited to diverticulitis, SBO, appendicitis, internal hernia, pancreatitis, cholelithiasis, pyelonephritis, appendicitis.  With regard to patient's headache and reported left-sided weakness while she was noted to be slightly hypertensive in triage on my assessment her BP is 146/80 and she has a completely nonfocal neurological exam.  CT head was obtained in triage is unremarkable for evidence of CVA or other acute intracranial abnormality.  Unclear the etiology for the symptoms will have a low suspicion for CVA or central acute infectious process at this time.  CT remarkable for evidence of acute uncomplicated appendicitis without evidence of clear abscess.  No evidence of SBO, diverticulitis, cholelithiasis, pyelonephritis.  Patient's urine does not appear infected.  Lipase is not consistent with acute appendicitis.  Patient is not appear septic at this time although she was given IV fluids and analgesia as noted below.  Given findings for acute appendicitis general surgery service/Dr. Aleen Campi was consulted.   Patient admitted in stable condition for further evaluation management.  ____________________________________________   FINAL CLINICAL IMPRESSION(S) / ED DIAGNOSES  Final diagnoses:  Acute appendicitis with localized peritonitis, without perforation, abscess, or gangrene  Lung nodule    Medications  lactated ringers bolus 1,000 mL (1,000 mLs Intravenous New Bag/Given 11/12/19 0911)  fentaNYL (SUBLIMAZE) injection 50 mcg (50 mcg Intravenous Given 11/12/19 0909)  ondansetron (ZOFRAN) injection 4 mg (4 mg Intravenous Given 11/12/19 0908)  iohexol (OMNIPAQUE) 300 MG/ML solution 100 mL (100 mLs Intravenous Contrast Given 11/12/19 0962)     ED Discharge Orders    None       Note:  This document was prepared using Dragon voice recognition software and may include unintentional dictation  errors.   Gilles Chiquito, MD 11/12/19 1009

## 2019-11-12 NOTE — Transfer of Care (Signed)
Immediate Anesthesia Transfer of Care Note  Patient: Creola Delia Rivera-Reyes  Procedure(s) Performed: APPENDECTOMY LAPAROSCOPIC (N/A )  Patient Location: PACU  Anesthesia Type:General  Level of Consciousness: drowsy  Airway & Oxygen Therapy: Patient Spontanous Breathing and Patient connected to face mask oxygen  Post-op Assessment: Report given to RN and Post -op Vital signs reviewed and stable  Post vital signs: Reviewed and stable  Last Vitals:  Vitals Value Taken Time  BP 110/55 11/12/19 1650  Temp 37.8 C 11/12/19 1650  Pulse 93 11/12/19 1652  Resp 15 11/12/19 1652  SpO2 99 % 11/12/19 1652  Vitals shown include unvalidated device data.  Last Pain:  Vitals:   11/12/19 1650  TempSrc:   PainSc: (P) Asleep         Complications: No complications documented.

## 2019-11-12 NOTE — ED Notes (Signed)
Pt assisted to the bathroom. Pt was able to urinate. Pt c/o a lot of pain while ambulating. Pt assisted back to bed and placed back on monitor. Colin Mulders, RN informed or pt pain while ambulating.

## 2019-11-12 NOTE — Op Note (Addendum)
  Procedure Date:  11/12/2019  Pre-operative Diagnosis:  Acute appendicitis and incisional umbilical hernia  Post-operative Diagnosis:  Acute appendicitis and incisional umbilical hernia  Procedure:  Laparoscopic appendectomy and open umbilical hernia repair  Surgeon:  Howie Ill, MD  Anesthesia:  General endotracheal  Estimated Blood Loss:  10 ml  Specimens:  appendix  Complications:  None  Indications for Procedure:  This is a 54 y.o. female who presents with abdominal pain and workup revealing acute appendicitis.  She also has a known incisional umbilical hernia from prior laparoscopic tubal ligation.  The options of surgery versus observation were reviewed with the patient and/or family. The risks of bleeding, infection, recurrence of symptoms, negative laparoscopy, potential for an open procedure, bowel injury, abscess or infection, were all discussed with the patient and she was willing to proceed.  Description of Procedure: The patient was correctly identified in the preoperative area and brought into the operating room.  The patient was placed supine with VTE prophylaxis in place.  Appropriate time-outs were performed.  Anesthesia was induced and the patient was intubated.  Foley catheter was placed.  Appropriate antibiotics were infused.  The abdomen was prepped and draped in a sterile fashion. An infraumbilical incision was made. Cautery was used to dissect down the subcutaneous tissue and to free up the umbilical stalk.  The patient had a small hernia defect and preperitoneal fat was protruding.  This was excised.  The hernia defect was extended in size to fit a Hasson trocar.  The fascial edges were cleaned up and a Hasson trocar was inserted.  Pneumoperitoneum was obtained with appropriate opening pressures.  Two 5-mm ports were placed in the suprapubic and left lateral positions under direct visualization.  The right lower quadrant was inspected and the appendix was  identified and found to be acutely inflamed and gangrenous but not perforated.  The patient had some ascitic fluid in the pelvis.  The appendix was carefully dissected.  The mesoappendix was divided using the LigaSure.  The base of the appendix was dissected out and divided with a standard load Endo GIA.  The appendix was placed in an Endocatch bag.  The right lower quadrant was then inspected again revealing an intact staple line, no bleeding, and no bowel injury.  The area was thoroughly irrigated.  The 5 mm ports were removed under direct visualization and the Hasson trocar was removed.  The Endocatch bag was brought out through the umbilical incision.  The incisional hernia was closed using 0 Vicryl sutures.  The stalk was reattached using 2-0 Vicryl.  Local anesthetic was infused in all incisions.  The umbilical incision was closed using 3-0 Vicryl and 4-0 Monocryl.  The other port incisions were closed with 4-0 Monocryl.  The wounds were cleaned and sealed with DermaBond.  Foley catheter was removed and the patient was emerged from anesthesia and extubated and brought to the recovery room for further management.  The patient tolerated the procedure well and all counts were correct at the end of the case.   Howie Ill, MD

## 2019-11-12 NOTE — H&P (Signed)
[facility] SURGICAL HISTORY & PHYSICAL (cpt [phone])  HISTORY OF PRESENT ILLNESS (HPI):  54 y.o. female presented to [facility] ED today for abdominal. Patient reports the onset of abdominal pain around yesterday afternoon. This pain was located near her umbilicus and was described as a sharp pain. She noted associated nausea and non-bloody non-bilious emesis. Interestingly, she had also noted left arm weakness/numbness in this timeframe as well. No fever, chills, cough, CP, SOB, bowel changes, urinary changes, or any additional neurological symptoms. She did not get any relief from her pain. Only previous surgical history is c-section. Work up in the ED revealed a leukocytosis to 11.2K, renal function was normal. CT Head was without any intracranial abnormalities. CT Abdomen/Pelvis was concerning for acute uncomplicated appendicitis.   General surgery is consulted by emergency medicine physician Dr Antoine Primas, MD for evaluation and management of acute appendicitis.    PAST MEDICAL HISTORY (PMH):  History reviewed. No pertinent past medical history.  Reviewed. Otherwise negative.   PAST SURGICAL HISTORY (PSH):  Past Surgical History:  Procedure Laterality Date  . VASCULAR SURGERY      Reviewed. Otherwise negative.   MEDICATIONS:  Prior to Admission medications   Not on File     ALLERGIES:  No Known Allergies   SOCIAL HISTORY:  Social History   Socioeconomic History  . Marital status: Married    Spouse name: Not on file  . Number of children: Not on file  . Years of education: Not on file  . Highest education level: Not on file  Occupational History  . Not on file  Tobacco Use  . Smoking status: Never Smoker  . Smokeless tobacco: Never Used  Substance and Sexual Activity  . Alcohol use: Never  . Drug use: Not on file  . Sexual activity: Not on file  Other Topics Concern  . Not on file  Social History Narrative  . Not on file   Social Determinants of  Health   Financial Resource Strain:   . Difficulty of Paying Living Expenses: Not on file  Food Insecurity:   . Worried About Programme researcher, broadcasting/film/video in the Last Year: Not on file  . Ran Out of Food in the Last Year: Not on file  Transportation Needs:   . Lack of Transportation (Medical): Not on file  . Lack of Transportation (Non-Medical): Not on file  Physical Activity:   . Days of Exercise per Week: Not on file  . Minutes of Exercise per Session: Not on file  Stress:   . Feeling of Stress : Not on file  Social Connections:   . Frequency of Communication with Friends and Family: Not on file  . Frequency of Social Gatherings with Friends and Family: Not on file  . Attends Religious Services: Not on file  . Active Member of Clubs or Organizations: Not on file  . Attends Banker Meetings: Not on file  . Marital Status: Not on file  Intimate Partner Violence:   . Fear of Current or Ex-Partner: Not on file  . Emotionally Abused: Not on file  . Physically Abused: Not on file  . Sexually Abused: Not on file     FAMILY HISTORY:  Family History  Problem Relation Age of Onset  . Breast cancer Maternal Aunt 60    Otherwise negative.   REVIEW OF SYSTEMS:  Review of Systems  Constitutional: Negative for chills and fever.  HENT: Negative for congestion and sore throat.   Respiratory: Negative  for cough and shortness of breath.   Cardiovascular: Negative for chest pain and palpitations.  Gastrointestinal: Positive for abdominal pain, nausea and vomiting. Negative for blood in stool, constipation and diarrhea.  Genitourinary: Negative for dysuria and urgency.  All other systems reviewed and are negative.   VITAL SIGNS:  Temp:  [99 F (37.2 C)] 99 F (37.2 C) (10/12 0826) Pulse Rate:  [84-98] 94 (10/12 0826) Resp:  [18-20] 20 (10/12 0826) BP: (102-166)/(74-113) 146/80 (10/12 0826) SpO2:  [97 %-99 %] 99 % (10/12 0826) Weight:  [86.2 kg] 86.2 kg (10/11 2223)     Height:  5' 1.02" (155 cm) Weight: 86.2 kg BMI (Calculated): 35.87   PHYSICAL EXAM:  Physical Exam Vitals and nursing note reviewed. Exam conducted with a chaperone present.  Constitutional:      General: She is not in acute distress.    Appearance: She is well-developed. She is obese. She is not ill-appearing.  HENT:     Head: Normocephalic and atraumatic.  Eyes:     General: No scleral icterus.    Extraocular Movements: Extraocular movements intact.  Cardiovascular:     Rate and Rhythm: Normal rate and regular rhythm.     Heart sounds: Normal heart sounds. No murmur heard.   Pulmonary:     Effort: Pulmonary effort is normal. No respiratory distress.     Breath sounds: Normal breath sounds.  Abdominal:     General: A surgical scar is present. There is no distension.     Palpations: Abdomen is soft.     Tenderness: There is abdominal tenderness in the right lower quadrant, periumbilical area and suprapubic area. There is no guarding or rebound. Positive signs include McBurney's sign. Negative signs include Murphy's sign.  Genitourinary:    Comments: Deferred Skin:    General: Skin is warm and dry.     Coloration: Skin is not jaundiced or pale.  Neurological:     General: No focal deficit present.     Mental Status: She is alert and oriented to person, place, and time.  Psychiatric:        Mood and Affect: Mood normal.        Behavior: Behavior normal.     INTAKE/OUTPUT:  This shift: No intake/output data recorded.  Last 2 shifts: @IOLAST2SHIFTS @  Labs:  CBC Latest Ref Rng & Units 11/11/2019  WBC 4.0 - 10.5 K/uL 11.2(H)  Hemoglobin 12.0 - 15.0 g/dL 01/11/2020  Hematocrit 36 - 46 % 41.6  Platelets 150 - 400 K/uL 234   CMP Latest Ref Rng & Units 11/11/2019  Glucose 70 - 99 mg/dL 01/11/2020)  BUN 6 - 20 mg/dL 19  Creatinine 740(C - 1.44 mg/dL 8.18  Sodium 5.63 - 149 mmol/L 135  Potassium 3.5 - 5.1 mmol/L 4.1  Chloride 98 - 111 mmol/L 101  CO2 22 - 32 mmol/L 23  Calcium 8.9 - 10.3 mg/dL  9.5  Total Protein 6.5 - 8.1 g/dL 7.7  Total Bilirubin 0.3 - 1.2 mg/dL 0.9  Alkaline Phos 38 - 126 U/L 80  AST 15 - 41 U/L 25  ALT 0 - 44 U/L 29    Imaging studies:   CT Abdomen/Pelvis (11/12/2019) personally reviewed showing inflammatory changes surrounding appendix with appendicolith and no abscess nor perforation, and radiologist report reviewed:  IMPRESSION: 1.  Findings indicative of acute appendiceal inflammation.  Appendix: Location: Appendix arises from the cecum extending inferomedially, located in the mid pelvic region to the right of midline.  Diameter: 1.6 cm  Appendicolith: Several, largest measuring 8 x 8 mm  Mucosal hyper-enhancement: Equivocal  Extraluminal gas: None  Periappendiceal collection: Soft tissue stranding along the course of the appendix and nearby fat structures noted. Slight fluid noted without well-defined abscess.  2. Right inguinal adenopathy as well as enlarged lymph nodes slightly superior to the umbilicus of uncertain etiology. No other adenopathy.  3. No abscess in the abdomen or pelvis. No bowel obstruction. No free air.  4. Hepatic steatosis with fatty sparing noted near the gallbladder fossa.  5. No renal or ureteral calculus. No hydronephrosis. Urinary bladder wall thickness normal.  6. 9 x 4 mm nodular opacity anterior left base. Non-contrast chest CT at 6-12 months is recommended. If the nodule is stable at time of repeat CT, then future CT at 18-24 months (from today's scan) is considered optional for low-risk patients, but is recommended for high-risk patients.    Assessment/Plan: (ICD-10's: K35.80) 54 y.o. female with abdominal pain, leukocytosis found to have acute uncomplicated appendicitis.    - Admit to general surgery  - Will plan for laparoscopic appendectomy +/- umbilical hernia repair this afternoon with Dr Aleen Campi pending OR/Anesthesia availability  - All risks, benefits, and alternatives to above  procedure(s) were discussed with the patient, all of her questions were answered to her expressed satisfaction, patient expresses she wishes to proceed, and informed consent was obtained.    - NPO + IVF Resuscitation  - IV Abx (Zosyn)  - Monitor abdominal examination  - Pain control prn; antiemetics prn  - Monitor leukocytosis   - DVT prophylaxis; hold for OR  All of the above findings and recommendations were discussed with the patient, and all of her questions were answered to her expressed satisfaction.  -- Lynden Oxford, PA-C [facility] 11/12/2019, 10:15 AM [phone] M-F: 7am - 4pm

## 2019-11-13 ENCOUNTER — Encounter: Payer: Self-pay | Admitting: Surgery

## 2019-11-13 LAB — CBC
HCT: 36 % (ref 36.0–46.0)
Hemoglobin: 11.8 g/dL — ABNORMAL LOW (ref 12.0–15.0)
MCH: 30 pg (ref 26.0–34.0)
MCHC: 32.8 g/dL (ref 30.0–36.0)
MCV: 91.6 fL (ref 80.0–100.0)
Platelets: 205 10*3/uL (ref 150–400)
RBC: 3.93 MIL/uL (ref 3.87–5.11)
RDW: 12.9 % (ref 11.5–15.5)
WBC: 10.7 10*3/uL — ABNORMAL HIGH (ref 4.0–10.5)
nRBC: 0 % (ref 0.0–0.2)

## 2019-11-13 LAB — BASIC METABOLIC PANEL
Anion gap: 9 (ref 5–15)
BUN: 11 mg/dL (ref 6–20)
CO2: 24 mmol/L (ref 22–32)
Calcium: 8.5 mg/dL — ABNORMAL LOW (ref 8.9–10.3)
Chloride: 108 mmol/L (ref 98–111)
Creatinine, Ser: 0.59 mg/dL (ref 0.44–1.00)
GFR, Estimated: >60 mL/min (ref >60)
Glucose, Bld: 126 mg/dL — ABNORMAL HIGH (ref 70–99)
Potassium: 3.9 mmol/L (ref 3.5–5.1)
Sodium: 141 mmol/L (ref 135–145)

## 2019-11-13 MED ORDER — OXYCODONE HCL 5 MG PO TABS
5.0000 mg | ORAL_TABLET | Freq: Four times a day (QID) | ORAL | 0 refills | Status: DC | PRN
Start: 2019-11-13 — End: 2019-11-27

## 2019-11-13 MED ORDER — AMOXICILLIN-POT CLAVULANATE 875-125 MG PO TABS: 1.0000 | ORAL_TABLET | Freq: Two times a day (BID) | ORAL | 0 refills | Status: AC

## 2019-11-13 MED ORDER — IBUPROFEN 800 MG PO TABS: 800.0000 mg | ORAL_TABLET | Freq: Three times a day (TID) | ORAL | 0 refills | Status: DC | PRN

## 2019-11-13 NOTE — TOC Transition Note (Signed)
Transition of Care [facility]) - CM/SW Discharge Note   Patient Details  Name: Syrita Dovel MRN: [id number] Date of Birth: Dec 21, 1965  Transition of Care [facility]) CM/SW Contact:  Margarito Liner, LCSW Phone Number: 11/13/2019, 12:22 PM   Clinical Narrative:  Patient has orders to discharge home today. She will discharge on three new medications. Per [facility] pharmacist, they will cost $46.33 total with a discount card. CSW has notified patient and she said she can afford this. No further concerns. CSW signing off.   Final next level of care: Home/Self Care Barriers to Discharge: No Barriers Identified   Patient Goals and CMS Choice        Discharge Placement                       Discharge Plan and Services     Post Acute Care Choice: NA                               Social Determinants of Health (SDOH) Interventions     Readmission Risk Interventions No flowsheet data found.

## 2019-11-13 NOTE — Discharge Summary (Signed)
[facility] SURGICAL DISCHARGE SUMMARY   Patient ID: Gabriela Lawrence MRN: [id number] DOB/AGE: 10-25-1965 54 y.o.  Admit date: 11/12/2019 Discharge date: 11/13/2019  Discharge Diagnoses Patient Active Problem List   Diagnosis Date Noted   Acute appendicitis 11/12/2019   Incisional hernia, without obstruction or gangrene     Consultants None  Procedures 11/12/2019:  Laparoscopic Appendectomy  HPI: 54 y.o. female presented to [facility] ED today for abdominal. Patient reports the onset of abdominal pain around yesterday afternoon. This pain was located near her umbilicus and was described as a sharp pain. She noted associated nausea and non-bloody non-bilious emesis. Interestingly, she had also noted left arm weakness/numbness in this timeframe as well. No fever, chills, cough, CP, SOB, bowel changes, urinary changes, or any additional neurological symptoms. She did not get any relief from her pain. Only previous surgical history is c-section. Work up in the ED revealed a leukocytosis to 11.2K, renal function was normal. CT Head was without any intracranial abnormalities. CT Abdomen/Pelvis was concerning for acute uncomplicated appendicitis.   Hospital Course: Informed consent was obtained and documented, and patient underwent uneventful laparoscopic appendectomy (Dr Aleen Campi, 11/12/2019).  Post-operatively, patient's pain/symptoms improved/resolved and advancement of patient's diet and ambulation were well-tolerated. The remainder of patient's hospital course was essentially unremarkable, and discharge planning was initiated accordingly with patient safely able to be discharged home with appropriate discharge instructions, antibiotics (augmentin x8 days), pain control, and outpatient follow-up after all of her questions were answered to her expressed satisfaction.   Discharge Condition: Good   Physical Examination:  Constitutional: Well appearing female,  NAD Pulmonary: Normal effort, no respiratory distress Gastrointestinal: Soft, incisional soreness, non-distended, no rebound/guarding Skin: Laparoscopic incisions are CDI with dermabond, no erythema or drainage   Allergies as of 11/13/2019   No Known Allergies     Medication List    TAKE these medications   amoxicillin-clavulanate 875-125 MG tablet Commonly known as: Augmentin Take 1 tablet by mouth 2 (two) times daily for 8 days.   ibuprofen 800 MG tablet Commonly known as: ADVIL Take 1 tablet (800 mg total) by mouth every 8 (eight) hours as needed.   oxyCODONE 5 MG immediate release tablet Commonly known as: Oxy IR/ROXICODONE Take 1 tablet (5 mg total) by mouth every 6 (six) hours as needed for severe pain or breakthrough pain.         Follow-up Information    Piscoya, Elita Quick, MD. Schedule an appointment as soon as possible for a visit in 2 week(s).   Specialty: General Surgery Why: s/p laparoscopic appendectomy Contact information: [address] [place] [address] [phone]                Time spent on discharge management including discussion of hospital course, clinical condition, outpatient instructions, prescriptions, and follow up with the patient and members of the medical team: >30 minutes  -- Lynden Oxford , PA-C [facility]  11/13/2019, 11:15 AM [phone] M-F: 7am - 4pm

## 2019-11-13 NOTE — Progress Notes (Signed)
Discharge instructions reviewed with the patient and her husband using the interpreter on a stick. IV removed

## 2019-11-13 NOTE — Anesthesia Postprocedure Evaluation (Signed)
Anesthesia Post Note  Patient: Gabriela Lawrence  Procedure(s) Performed: APPENDECTOMY LAPAROSCOPIC (N/A )  Patient location during evaluation: PACU Anesthesia Type: General Level of consciousness: awake and alert and oriented Pain management: pain level controlled Vital Signs Assessment: post-procedure vital signs reviewed and stable Respiratory status: spontaneous breathing, nonlabored ventilation and respiratory function stable Cardiovascular status: blood pressure returned to baseline and stable Postop Assessment: no signs of nausea or vomiting Anesthetic complications: no   No complications documented.   Last Vitals:  Vitals:   11/13/19 0005 11/13/19 0515  BP: 91/63 126/71  Pulse: 69 70  Resp: 16 16  Temp: 36.9 C 36.6 C  SpO2: 96% 98%    Last Pain:  Vitals:   11/13/19 0238  TempSrc:   PainSc: Asleep                 Pearlie Nies

## 2019-11-14 LAB — SURGICAL PATHOLOGY

## 2019-11-27 ENCOUNTER — Ambulatory Visit (INDEPENDENT_AMBULATORY_CARE_PROVIDER_SITE_OTHER): Payer: Self-pay | Admitting: Surgery

## 2019-11-27 ENCOUNTER — Encounter: Payer: Self-pay | Admitting: Surgery

## 2019-11-27 ENCOUNTER — Other Ambulatory Visit: Payer: Self-pay

## 2019-11-27 VITALS — BP 115/79 | HR 60 | Temp 97.8°F | Resp 12 | Ht 60.0 in | Wt 194.0 lb

## 2019-11-27 DIAGNOSIS — Z09 Encounter for follow-up examination after completed treatment for conditions other than malignant neoplasm: Secondary | ICD-10-CM

## 2019-11-27 DIAGNOSIS — K3580 Unspecified acute appendicitis: Secondary | ICD-10-CM

## 2019-11-27 DIAGNOSIS — K432 Incisional hernia without obstruction or gangrene: Secondary | ICD-10-CM

## 2019-11-27 NOTE — Progress Notes (Signed)
11/27/2019  HPI: Gabriela Lawrence is a 54 y.o. female s/p laparoscopic appendectomy and incisional umbilical hernia repair on 11/12/19.  She was discharged on 10/13 with antibiotic course.  Denies any issues.  She's eating well, without any nausea or vomiting, having normal bowel function, without any significant incisional pain.  Of note, patient reports that she has a skin mass on her back that she would like me to look at.  Vital signs: BP 115/79   Pulse 60   Temp 97.8 F (36.6 C) (Oral)   Resp 12   Ht 5' (1.524 m)   Wt 194 lb (88 kg)   SpO2 98%   BMI 37.89 kg/m    Physical Exam: Constitutional: No acute distress Abdomen:  Soft, non-distended, non-tender.  Incisions clean, dry, intact.  No evidence of hernia recurrence. Skin:  Patient has a 2 cm skin wart with broad stalk in the lower back, nontender, nonulcerated.  Assessment/Plan: This is a 54 y.o. female s/p laparoscopic appendectomy and incisional umbilical hernia repair.  --Reviewed pathology with patient -- appendicitis, no evidence of malignancy. --Patient is doing well after surgery.  Continue activity restriction for total 4 weeks. --Patient will call us at her convenience to schedule a procedure visit so we can excise the skin mass.   Howie Ill, MD [facility]

## 2019-11-27 NOTE — Patient Instructions (Signed)

## 2020-09-04 ENCOUNTER — Encounter: Payer: Self-pay | Admitting: Primary Care

## 2021-04-08 ENCOUNTER — Telehealth: Payer: Self-pay | Admitting: *Deleted

## 2021-05-25 ENCOUNTER — Ambulatory Visit: Payer: Self-pay

## 2021-06-08 ENCOUNTER — Ambulatory Visit: Payer: Self-pay

## 2021-06-11 ENCOUNTER — Other Ambulatory Visit: Payer: Self-pay

## 2021-06-11 DIAGNOSIS — Z1231 Encounter for screening mammogram for malignant neoplasm of breast: Secondary | ICD-10-CM

## 2021-06-11 DIAGNOSIS — Z1211 Encounter for screening for malignant neoplasm of colon: Secondary | ICD-10-CM

## 2021-06-15 ENCOUNTER — Ambulatory Visit: Payer: Self-pay | Attending: Hematology and Oncology | Admitting: *Deleted

## 2021-06-15 ENCOUNTER — Encounter: Payer: Self-pay | Admitting: *Deleted

## 2021-06-15 ENCOUNTER — Ambulatory Visit
Admission: RE | Admit: 2021-06-15 | Discharge: 2021-06-15 | Disposition: A | Discharge status: Home / Self Care | Payer: Self-pay | Source: Ambulatory Visit | Attending: Obstetrics and Gynecology | Admitting: Obstetrics and Gynecology

## 2021-06-15 VITALS — BP 107/69 | HR 74 | Resp 18 | Wt 196.0 lb

## 2021-06-15 DIAGNOSIS — Z01419 Encounter for gynecological examination (general) (routine) without abnormal findings: Secondary | ICD-10-CM

## 2021-06-15 DIAGNOSIS — Z1231 Encounter for screening mammogram for malignant neoplasm of breast: Secondary | ICD-10-CM | POA: Insufficient documentation

## 2021-06-15 NOTE — Progress Notes (Signed)
[name] is a 56 y.o. female who presents to [facility] clinic today with no complaints. She presents today for clinical breast exam and mammogram only.  ?  ?Pap Smear: Pap not smear completed today. Last Pap smear was on 08/26/20 at the [facility] clinic and was normal with negative / negative results. Per patient has no history of an abnormal Pap smear. Last Pap smear result is available in Epic. ?  ?Physical exam: ?Breasts ?Breasts symmetrical. No skin abnormalities bilateral breasts. No nipple retraction bilateral breasts. No nipple discharge bilateral breasts. No lymphadenopathy. No lumps palpated bilateral breasts.     ?  ?Pelvic/Bimanual ?Pap is not indicated today  ?  ?Smoking History: ?Patient has never smoked. ?  ?Patient Navigation: ?Patient education provided. Access to services provided for patient through [place], the interpreter provided interpretation. No transportation provided  ? ?Colorectal Cancer Screening: ?Per patient has never had colonoscopy completed .  States she will have a FIT test from [facility] tomorrow.  No complaints today.  ?  ?Breast and Cervical Cancer Risk Assessment: ?Patient does not have family history of breast cancer, known genetic mutations, or radiation treatment to the chest before age 70. She does have a family history of colon cancer in her sister.  Patient does not have history of cervical dysplasia, immunocompromised, or DES exposure in-utero. ? ?Risk Assessment   ?No risk assessment data for the current encounter ? Risk Scores   ? ?   11/26/2018  ? Last edited by: Orson Slick, CMA  ? 5-year risk: 0.6 %  ? Lifetime risk: 4.3 %  ? ?  ?  ? ?  ? ?Risk Assessment   ? ? Risk Scores   ? ?   06/15/2021 11/26/2018  ? Last edited by: Drue Dun, RN [place], Laddie Aquas, CMA  ? 5-year risk: 0.6 % 0.6 %  ? Lifetime risk: 4.1 % 4.3 %  ? ?  ?  ? ?  ?  ?A: ?BCCCP exam without pap smear ? ?P: ?Referred patient to the [facility] for a screening mammogram. Appointment scheduled for today. ? ?[name], RN ?06/15/2021 1:59 PM   ?

## 2021-07-16 ENCOUNTER — Ambulatory Visit: Payer: Self-pay | Admitting: Surgery

## 2021-07-28 ENCOUNTER — Ambulatory Visit (INDEPENDENT_AMBULATORY_CARE_PROVIDER_SITE_OTHER): Payer: Self-pay | Admitting: Surgery

## 2021-07-28 ENCOUNTER — Encounter: Payer: Self-pay | Admitting: Surgery

## 2021-07-28 VITALS — BP 123/79 | HR 62 | Temp 98.0°F | Ht 61.02 in | Wt 195.6 lb

## 2021-07-28 DIAGNOSIS — L918 Other hypertrophic disorders of the skin: Secondary | ICD-10-CM

## 2021-07-28 DIAGNOSIS — K432 Incisional hernia without obstruction or gangrene: Secondary | ICD-10-CM

## 2021-07-28 NOTE — Patient Instructions (Addendum)
haremos que Adell lo llame para informarle cul puede ser el costo.  Papiloma cutneo en los adultos Skin Tag, Adult  Un papiloma cutneo (acrocordn) es un crecimiento extra de piel suave. La mayora de los papilomas cutneos tienen el mismo color de la piel y no son ms grandes que la goma de un lpiz. Generalmente se forman en zonas donde hay roce o friccin frecuente sobre la piel. Estos lugares pueden ser donde hay pliegues en la piel, como los prpados, el cuello, las axilas o la ingle. Los papilomas cutneos no son peligrosos y no se transmiten de Neomia Dear persona a Theodoro Clock (no son contagiosos). Puede tener un solo papiloma cutneo o varios. Los papilomas cutneos no requieren TEFL teacher. Sin embargo, el mdico puede recomendarle que se lo quite si: Se irrita con el roce de la ropa o de joyas. Sangra. Se ve y tiene un aspecto antiesttico. Cules son las causas? Esta afeccin se relaciona con lo siguiente: Edad avanzada. Embarazo. Diabetes. Obesidad. Cules son los signos o sntomas? Habitualmente, los papilomas cutneos no causan sntomas, a menos que se irriten por objetos que toquen la piel, como ropa o [place]. Cuando esto sucede, usted puede [employer], picazn o sangrado. Cmo se diagnostica? Esta afeccin se diagnostica a travs de una evaluacin que realiza el mdico. No se necesita ningn estudio para el diagnstico. Cmo se trata? El tratamiento de esta afeccin depende de la presencia de sntomas. Si es necesario retirar un papiloma cutneo, el mdico puede extirparlo con: Un procedimiento quirrgico simple con tijeras. Un procedimiento en el que se congela el papiloma cutneo con un gas en forma de lquido (nitrgeno lquido). Un procedimiento que [place] calor para destruir el papiloma cutneo (electrodesecacin). El mdico tambin puede retirarle el papiloma cutneo si es visible o antiesttico. Siga estas instrucciones en su casa: Controle el papiloma para detectar  cualquier cambio. Un papiloma cutneo normal no requiere ningn otro cuidado especial en el hogar. Use los medicamentos de venta libre y los recetados solamente como se lo haya indicado el mdico. Oceanographer a todas las visitas de seguimiento como se lo haya indicado el mdico. Esto es importante. Comunquese con un mdico si: Tiene un papiloma cutneo que: Duele. Cambia de color. Sangra. Se hincha. Resumen Los papilomas cutneos son crecimientos extra de piel suave que se encuentran en reas de roce o friccin frecuente. Generalmente, los papilomas cutneos no causan sntomas. Si aparecen sntomas, puede tener dolor, picazn o sangrado. Si el papiloma cutneo causa sntomas o es antiesttico, el mdico puede Customer service manager. Esta informacin no tiene Theme park manager el consejo del mdico. Asegrese de hacerle al mdico cualquier pregunta que tenga. Document Revised: 01/30/2019 Document Reviewed: 01/30/2019 Elsevier Patient Education  2022 Elsevier Inc.   Hernia umbilical en los adultos Umbilical Hernia, Adult  Una hernia es un bulto de tejido que sobresale a travs de una abertura [employer]. Una hernia umbilical se produce en el abdomen, cerca del ombligo. La hernia puede contener tejidos del intestino delgado, el intestino grueso o tejido graso que recubre los intestinos. Las hernias [employer]AA en los adultos suelen empeorar con el tiempo y requieren tratamiento quirrgico. Existen diferentes tipos de hernias umbilicales, entre ellas: Hernia indirecta. Este tipo se encuentra justo encima o debajo del ombligo. Es el tipo de hernia umbilical ms frecuente en los adultos. Hernia directa. Este tipo se forma a travs de una abertura formada por el ombligo. Hernia reducible. Este tipo de hernia viene y va. Puede ser visible solo al hacer fuerza,  levantar objetos pesados o toser. Este tipo de hernia se puede reintroducir en el abdomen (reducir). Hernia encarcelada. Este tipo atrapa el  tejido abdominal dentro de la hernia. Este tipo de hernia es irreducible. Hernia estrangulada. Se trata de una hernia que interrumpe el flujo de sangre a los tejidos en su interior. Si esto ocurre, los tejidos Mining engineer a Furniture conservator/restorer. Este tipo de hernia requiere tratamiento de Associate Professor. Cules son las causas? Una hernia umbilical se produce cuando el tejido dentro del abdomen ejerce presin sobre una zona debilitada de los msculos abdominales. Qu incrementa el riesgo? Puede correr un mayor riesgo de Warehouse manager esta afeccin en los siguientes casos: Tiene obesidad. Tuvo varios embarazos. Tiene una acumulacin de lquido dentro del abdomen. Se someti a una ciruga que [employer] abdominales. Cules son los signos o sntomas? El principal sntoma de esta afeccin es un bulto en el ombligo o cerca de este que no causa dolor. Una hernia reducible puede ser visible solo al hacer fuerza, levantar objetos pesados o toser. Otros sntomas pueden incluir lo siguiente: Dolor sordo. Sensacin de opresin. Los sntomas de una hernia estrangulada pueden incluir los siguientes: Dolor que se vuelve cada vez ms intenso. Nuseas y vmitos. Dolor al ejercer presin sobre la hernia. Cambio de color de la piel que recubre la hernia que se torna roja o violcea. Estreimiento. Sangre en las heces. Cmo se diagnostica? Esta afeccin se puede diagnosticar en funcin de lo siguiente: Un examen fsico. Pueden pedirle que tosa o que haga fuerza mientras est de pie. Estas acciones aumentan la presin dentro del abdomen y pueden empujar la hernia a travs de la abertura en los msculos. El mdico puede ejercer presin sobre la hernia para tratar de reducirla. Los sntomas y los antecedentes mdicos. Cmo se trata? La ciruga es el nico tratamiento para una hernia umbilical. En el caso de que la hernia est estrangulada, esta se realiza lo antes posible. Si tiene una hernia pequea que no est  encarcelada, tal vez tenga que bajar de peso antes de la [place]. Siga estas instrucciones en su casa: Baje de peso, si se lo indic el mdico. No trate de reintroducir la hernia. Observe si la hernia cambia de color o de tamao. Infrmele al mdico si se producen cambios. Tal vez deba evitar las actividades que aumentan la presin sobre la hernia. No levante ningn objeto que pese ms de 10 libras (4.5 kg) o que supere el lmite de peso que le hayan indicado, Teacher, adult education que el mdico le diga que puede [place]. Use los medicamentos de venta libre y los recetados solamente como se lo haya indicado el mdico. Cumpla con todas las visitas de seguimiento. Esto es importante. Comunquese con un mdico si: La hernia se agranda. La hernia le causa dolor. Solicite ayuda de inmediato si: Tiene un dolor intenso y repentino cerca de la zona de la hernia. Tiene dolor, as como nuseas o vmitos. Tiene dolor y la piel que recubre la hernia cambia de color. Tiene escalofros o fiebre. Resumen Una hernia es un bulto de tejido que sobresale a travs de una abertura [employer]. Una hernia umbilical se produce cerca del ombligo. La ciruga es el nico tratamiento para una hernia umbilical. No trate de reintroducir la hernia. Cumpla con todas las visitas de seguimiento. Esto es importante. Esta informacin no tiene Theme park manager el consejo del mdico. Asegrese de hacerle al mdico cualquier pregunta que tenga. Document Revised: 06/12/2020 Document Reviewed: 06/12/2020 Elsevier Patient Education  2023 Elsevier Inc.  

## 2021-07-28 NOTE — H&P (View-Only) (Signed)
07/28/2021  History of Present Illness: Gabriela Lawrence is a 56 y.o. female presenting for evaluation of a recurrent incisional hernia.  The patient had a laparoscopic appendectomy with repair of an incisional umbilical hernia on 11/12/19.  She had had a prior laparoscopic tubal ligation and had developed a hernia at the umbilicus from it.  Her hernia was repaired primarily only with sutures due to the appendicitis.  She reports that shortly after she developed a hernia again, and started feeling a small ball or bulging in the same area.  More recently though, she feels that this has become more symptomatic and reports pain/swelling sensation at the umbilicus and sometimes feels more bloated/swollen after eating.  The pain is not constant, and does not radiated out of the umbilical area.  Denies any nausea, vomiting, or other areas of pain.  She also notes that she still has the skin mass or tag in her right lower back and would also like to have this addressed.  She denies any particular increase in size, but it also bothers her more, particularly when she sits/lies on her back.  Denies any redness or drainage.  Past Medical History: Past Medical History:  Diagnosis Date   Appendicitis 11/12/2019   Skin tag    right lower back     Past Surgical History: Past Surgical History:  Procedure Laterality Date   LAPAROSCOPIC APPENDECTOMY N/A 11/12/2019   Procedure: APPENDECTOMY LAPAROSCOPIC;  Surgeon: Henrene Dodge, MD;  Location: [facility] ORS;  Service: General;  Laterality: N/A;   VASCULAR SURGERY      Home Medications: Prior to Admission medications   Medication Sig Start Date End Date Taking? Authorizing Provider  levothyroxine (SYNTHROID) 25 MCG tablet Take 25 mcg by mouth daily. 04/21/21  Yes [provider]    Allergies: No Known Allergies  Review of Systems: Review of Systems  Constitutional:  Negative for chills and fever.  HENT:  Negative for hearing loss.    Respiratory:  Negative for shortness of breath.   Cardiovascular:  Negative for chest pain.  Gastrointestinal:  Positive for abdominal pain. Negative for constipation, diarrhea, nausea and vomiting.  Genitourinary:  Negative for dysuria.  Musculoskeletal:  Negative for myalgias.  Skin:        Skin tag / wart in the right lower back  Neurological:  Negative for dizziness.  Psychiatric/Behavioral:  Negative for depression.     Physical Exam BP 123/79   Pulse 62   Temp 98 F (36.7 C)   Ht 5' 1.02" (1.55 m)   Wt 195 lb 9.6 oz (88.7 kg)   SpO2 97%   BMI 36.93 kg/m  CONSTITUTIONAL: No acute distress, well nourished. HEENT:  Normocephalic, atraumatic, extraocular motion intact. NECK:  Trachea is midline, no jugular venous distention. RESPIRATORY:  Normal respiratory effort without pathologic use of accessory muscles. CARDIOVASCULAR: Regular rhythm and rate. GI: The abdomen is soft, no-distended, with some discomfort in the periumbilical area.  The incisions are well healed, but she does have a small 1 cm recurrence in her incisional umbilical hernia, likely containing fat only.  It reduces easily.  MUSCULOSKELETAL:  No peripheral edema, normal gait. SKIN:  The patient has in the right lower back a skin tag or wart, with a stalk that is about 8 mm wide, and the size of the wart itself is about 1 x 2.5 cm.  It is soft, without any tenderness. NEUROLOGIC:  Motor and sensation is grossly normal.  Cranial nerves are grossly intact. PSYCH:  Alert and oriented to person, place and time. Affect is normal.  Assessment and Plan: This is a 56 y.o. female with a recurrent incisional hernia at the umbilicus and a right lower back skin tag/wart.  --Discussed with the patient that indeed she has a recurrence to her incisional hernia.  Discussed with her that given the appendicitis, we could only repair her hernia with sutures alone, and with the acute infection she had, the tissues are also a bit  weaker in their healing.  Now however, we could repair her hernia in a more formal and complete way.  Unfortunately there is no medication or exercise that could repair the hernia, and given that she's having symptoms from it, would recommend repair.  She's in agreement.  She also would like to address the skin tag/wart and excise this too and would prefer to do the skin tag first.  I think that's reasonable. --Would then plan for an office procedure for excision of the skin tag on 08/16/21, followed by a robotic assisted incisional hernia repair with mesh the following week on 08/24/21.  The patient would like to know an estimate of the surgical and hospital costs with these procedures.  At the time I do not have that information, but advised that our scheduler Barbara Vance will contact them next week to explain more of the details.  If they would like to reschedule based on the costs, that would be perfectly reasonable.  I also gave them the application for [place] Financial assistance that they can complete now too to see if they can qualify for any assistance for the costs. --Discussed with the patient both procedures at length and reviewed with her the risks of bleeding, infection, injury to surrounding structures, the use of mesh for the hernia repair, that both are outpatient procedures, post-op activity restriction after her hernia surgery, and she's willing to proceed. --All of her questions have been answered.  I spent 40 minutes dedicated to the care of this patient on the date of this encounter to include pre-visit review of records, face-to-face time with the patient discussing diagnosis and management, and any post-visit coordination of care.   Joriel Streety Luis Hala Narula, MD [facility]     

## 2021-07-28 NOTE — Progress Notes (Signed)
07/28/2021  History of Present Illness: Gabriela Lawrence is a 56 y.o. female presenting for evaluation of a recurrent incisional hernia.  The patient had a laparoscopic appendectomy with repair of an incisional umbilical hernia on 11/12/19.  She had had a prior laparoscopic tubal ligation and had developed a hernia at the umbilicus from it.  Her hernia was repaired primarily only with sutures due to the appendicitis.  She reports that shortly after she developed a hernia again, and started feeling a small ball or bulging in the same area.  More recently though, she feels that this has become more symptomatic and reports pain/swelling sensation at the umbilicus and sometimes feels more bloated/swollen after eating.  The pain is not constant, and does not radiated out of the umbilical area.  Denies any nausea, vomiting, or other areas of pain.  She also notes that she still has the skin mass or tag in her right lower back and would also like to have this addressed.  She denies any particular increase in size, but it also bothers her more, particularly when she sits/lies on her back.  Denies any redness or drainage.  Past Medical History: Past Medical History:  Diagnosis Date   Appendicitis 11/12/2019   Skin tag    right lower back     Past Surgical History: Past Surgical History:  Procedure Laterality Date   LAPAROSCOPIC APPENDECTOMY N/A 11/12/2019   Procedure: APPENDECTOMY LAPAROSCOPIC;  Surgeon: Henrene Dodge, MD;  Location: [facility] ORS;  Service: General;  Laterality: N/A;   VASCULAR SURGERY      Home Medications: Prior to Admission medications   Medication Sig Start Date End Date Taking? Authorizing Provider  levothyroxine (SYNTHROID) 25 MCG tablet Take 25 mcg by mouth daily. 04/21/21  Yes [provider]    Allergies: No Known Allergies  Review of Systems: Review of Systems  Constitutional:  Negative for chills and fever.  HENT:  Negative for hearing loss.    Respiratory:  Negative for shortness of breath.   Cardiovascular:  Negative for chest pain.  Gastrointestinal:  Positive for abdominal pain. Negative for constipation, diarrhea, nausea and vomiting.  Genitourinary:  Negative for dysuria.  Musculoskeletal:  Negative for myalgias.  Skin:        Skin tag / wart in the right lower back  Neurological:  Negative for dizziness.  Psychiatric/Behavioral:  Negative for depression.     Physical Exam BP 123/79   Pulse 62   Temp 98 F (36.7 C)   Ht 5' 1.02" (1.55 m)   Wt 195 lb 9.6 oz (88.7 kg)   SpO2 97%   BMI 36.93 kg/m  CONSTITUTIONAL: No acute distress, well nourished. HEENT:  Normocephalic, atraumatic, extraocular motion intact. NECK:  Trachea is midline, no jugular venous distention. RESPIRATORY:  Normal respiratory effort without pathologic use of accessory muscles. CARDIOVASCULAR: Regular rhythm and rate. GI: The abdomen is soft, no-distended, with some discomfort in the periumbilical area.  The incisions are well healed, but she does have a small 1 cm recurrence in her incisional umbilical hernia, likely containing fat only.  It reduces easily.  MUSCULOSKELETAL:  No peripheral edema, normal gait. SKIN:  The patient has in the right lower back a skin tag or wart, with a stalk that is about 8 mm wide, and the size of the wart itself is about 1 x 2.5 cm.  It is soft, without any tenderness. NEUROLOGIC:  Motor and sensation is grossly normal.  Cranial nerves are grossly intact. PSYCH:  Alert and oriented to person, place and time. Affect is normal.  Assessment and Plan: This is a 56 y.o. female with a recurrent incisional hernia at the umbilicus and a right lower back skin tag/wart.  --Discussed with the patient that indeed she has a recurrence to her incisional hernia.  Discussed with her that given the appendicitis, we could only repair her hernia with sutures alone, and with the acute infection she had, the tissues are also a bit  weaker in their healing.  Now however, we could repair her hernia in a more formal and complete way.  Unfortunately there is no medication or exercise that could repair the hernia, and given that she's having symptoms from it, would recommend repair.  She's in agreement.  She also would like to address the skin tag/wart and excise this too and would prefer to do the skin tag first.  I think that's reasonable. --Would then plan for an office procedure for excision of the skin tag on 08/16/21, followed by a robotic assisted incisional hernia repair with mesh the following week on 08/24/21.  The patient would like to know an estimate of the surgical and hospital costs with these procedures.  At the time I do not have that information, but advised that our scheduler Toy Cookey will contact them next week to explain more of the details.  If they would like to reschedule based on the costs, that would be perfectly reasonable.  I also gave them the application for [facility] Financial assistance that they can complete now too to see if they can qualify for any assistance for the costs. --Discussed with the patient both procedures at length and reviewed with her the risks of bleeding, infection, injury to surrounding structures, the use of mesh for the hernia repair, that both are outpatient procedures, post-op activity restriction after her hernia surgery, and she's willing to proceed. --All of her questions have been answered.  I spent 40 minutes dedicated to the care of this patient on the date of this encounter to include pre-visit review of records, face-to-face time with the patient discussing diagnosis and management, and any post-visit coordination of care.   Howie Ill, MD [facility]

## 2021-07-29 ENCOUNTER — Telehealth: Payer: Self-pay

## 2021-07-29 NOTE — Telephone Encounter (Signed)
Pt has been advised of Pre-Admission date/time, and Surgery date via Interpreter services.  Surgery Date: 08/24/21 Preadmission Testing Date: 08/12/21 (phone 8A-12P)  Patient has been made aware to call [phone], between 1-3:00pm the day before surgery, to find out what time to arrive for surgery.

## 2021-08-12 ENCOUNTER — Encounter
Admission: RE | Admit: 2021-08-12 | Discharge: 2021-08-12 | Disposition: A | Discharge status: Home / Self Care | Payer: Self-pay | Source: Ambulatory Visit | Attending: Surgery | Admitting: Surgery

## 2021-08-12 HISTORY — DX: Hypothyroidism, unspecified: E03.9

## 2021-08-12 HISTORY — DX: Prediabetes: R73.03

## 2021-08-12 NOTE — Patient Instructions (Addendum)
Your procedure is scheduled on: Tuesday August 24, 2021. Su procedimiento est programado para: Martes 25 de [place] del 2023. Report to Day Surgery inside [facility] 2nd [facility], stop by admissions desk before getting on elevator.  Presntese a: Diplomatic Services operational officer del [facility] 2ndo piso, registrese primero antes de subir al [employer].  To find out your arrival time please call [phone] between 1PM - 3PM on Monday August 23, 2021. Para saber su hora de llegada por favor llame al [phone] Eusebio Me la 1PM - 3PM el da: Lunes 24 de [place] del 2023.  Remember: Instructions that are not followed completely may result in serious medical risk, up to and including death,  or upon the discretion of your surgeon and anesthesiologist your surgery may need to be rescheduled.  Recuerde: Las instrucciones que no se siguen completamente Armed forces logistics/support/administrative officer en un riesgo de salud grave, incluyendo hasta  la [place] o a discrecin de su cirujano y Scientific laboratory technician, su ciruga se puede posponer.   __X_ 1.Do not eat food after midnight the night before your procedure. No    gum chewing or hard candies. You may drink clear liquids up to 2 hours     before you are scheduled to arrive for your surgery- DO not drink clear     Liquids within 2 hours of the start of your surgery.     Clear Liquids include:    water,       No coma nada despus de la medianoche de la noche anterior a su    procedimiento. No coma chicles ni caramelos duros. Puede tomar    lquidos claros hasta 2 horas antes de su hora programada de llegada al     hospital para su procedimiento. No tome lquidos claros durante el     transcurso de las 2 horas de su llegada programada al hospital para su     procedimiento, ya que esto puede llevar a que su procedimiento se    retrase o tenga que volver a Magazine features editor.  Los lquidos claros incluyen:          Gabriela Lawrence               _X__ 3.Do Not Smoke or use e-cigarettes For 24 Hours Prior to Your  Surgery.    Do not use any chewable tobacco products for at least 6   hours prior to surgery.    No fume ni use cigarrillos electrnicos durante las 24 horas previas    a su [place].  No use ningn producto de tabaco masticable durante   al menos 6 horas antes de la [place].     __X_ 4. No alcohol for 24 hours before or after surgery.    No tome alcohol durante las 24 horas antes ni despus de la [place].   __X__5. On the morning of surgery brush your teeth with toothpaste and water, you                may rinse your mouth with mouthwash if you wish.  Do not swallow any toothpaste of mouthwash.   En la maana de la [place], cepllese los dientes con pasta de dientes y [place],                [place] enjuagarse la boca con enjuague bucal si lo desea. No ingiera ninguna pasta de dientes o enjuague bucal.   __X__ 6. Notify your doctor if there is any change in your medical condition (cold,fever, infections).  Informe a su mdico si hay algn cambio en su condicin mdica  (resfriado, fiebre, infecciones).   Do not wear jewelry, make-up, hairpins, clips or nail polish.  No use joyas, maquillajes, pinzas/ganchos para el cabello ni esmalte de uas.  Do not wear lotions, powders, or perfumes. You may wear deodorant.  No use lociones, polvos o perfumes.  Puede usar desodorante.    Do not shave 48 hours prior to surgery. Men may shave face and neck.  No se afeite 48 horas antes de la [place].   Do not bring valuables to the hospital.   No lleve objetos de valor al hospital.  [facility] is not responsible for any belongings or valuables.  [place] no se hace responsable de ningn tipo de pertenencias u objetos de Licensed conveyancer.               Contacts, dentures or bridgework may not be worn into surgery.  Los lentes de [place], las dentaduras postizas o puentes no se pueden usar en la [place].   Leave your suitcase in the car. After surgery it may be brought to your room.  Deje su maleta en el  auto.  Despus de la ciruga podr traerla a su habitacin.   For patients admitted to the hospital, discharge time is determined by your  treatment team.  Para los pacientes que sean ingresados al hospital, el tiempo en el cual se le  dar de alta es determinado por su equipo de [place].   Patients discharged the day of surgery will not be allowed to drive home. A los pacientes que se les da de alta el mismo da de la ciruga no se les permitir conducir a Higher education careers adviser.   __X__ Take these medicines the morning of surgery with A SIP OF WATER:          [employer] estas medicinas la maana de la ciruga con UN SORBO DE AGUA:  1. levothyroxine (SYNTHROID) 25 MCG   2.   3.   4.       5.  6.  ____ Fleet Enema (as directed)          Enema de Fleet (segn lo indicado)    __X__ Use CHG Soap as directed          Utilice el jabn de CHG segn lo indicado  ____ Use inhalers on the day of surgery          Use los inhaladores el da de la ciruga  ____ Stop metformin 2 days prior to surgery          Deje de tomar el metformin 2 das antes de la ciruga    ____ Take 1/2 of usual insulin dose the night before surgery and none on the morning of surgery           Tome la mitad de la dosis habitual de insulina la noche antes de la [place] y no tome nada en la maana de la             ciruga  __X__ Stop Anti-inflammatories such as Ibuprofen, Aleve, Advil, Motrin, Naprosyn, Meloxicam, Lodine, Ketoralac, Midol, and aspirin containing products like Excedrin, Goody's and or BC Powders.          Deje de tomar antiinflamatorios como Ibuprofen, Aleve, Advil, Motrin, Naprosyn, Meloxicam, Lodine, Ketoralac, Midol, o productos con aspirina como Excedrin, Goody's and or BC Powders.   __X__ Stop supplements until after surgery  Deje de tomar suplementos hasta despus de la ciruga  ____ Bring C-Pap to the hospital          Lleve el C-Pap al hospital    If you have any questions regarding your  pre-procedure instructions,  Please call Pre-admit Testing at [phone]  Si tiene alguna pregunta con respecto a las instrucciones previas al procedimiento, Llame a Pruebas previas a la admisin al [phone]

## 2021-08-16 ENCOUNTER — Other Ambulatory Visit: Payer: Self-pay | Admitting: Surgery

## 2021-08-16 ENCOUNTER — Ambulatory Visit (INDEPENDENT_AMBULATORY_CARE_PROVIDER_SITE_OTHER): Payer: Self-pay | Admitting: Surgery

## 2021-08-16 ENCOUNTER — Encounter: Payer: Self-pay | Admitting: Surgery

## 2021-08-16 VITALS — BP 133/70 | HR 79 | Temp 98.4°F | Wt 195.8 lb

## 2021-08-16 DIAGNOSIS — L918 Other hypertrophic disorders of the skin: Secondary | ICD-10-CM

## 2021-08-16 DIAGNOSIS — R222 Localized swelling, mass and lump, trunk: Secondary | ICD-10-CM

## 2021-08-16 NOTE — Progress Notes (Signed)
  Procedure Date:  08/16/2021  Pre-operative Diagnosis:  Lower back skin mass  Post-operative Diagnosis:  Lower back skin mass, 3 cm size.  Procedure:  Excision of lower back skin mass  Surgeon:  Howie Ill, MD  Assistant:  Ronney Asters, PA-S  Anesthesia:  6 ml of 1% lidocaine with epi  Estimated Blood Loss:  3 ml  Specimens:  lower back skin tag  Complications:  None  Indications for Procedure:  This is a 56 y.o. female with diagnosis of a lower back skin mass.  The patient reports there's been increasing discomfort from it and would like to have it excised.  The patient wishes to have this excised. The risks of bleeding, abscess or infection, injury to surrounding structures, and need for further procedures were all discussed with the patient and she was willing to proceed.  Description of Procedure: The patient was correctly identified at bedside.  The patient was placed supine.  Appropriate time-outs were performed.  The patient's lower back was prepped and draped in usual sterile fashion.  Local anesthetic was infused intradermally.  An elliptical 3 cm incision was made over the skin mass, and scalpel was used to dissect down the skin and subcutaneous tissue.  Skin flaps were created sharply, and then the mass was excised intact.  It was sent off to pathology.  The cavity was then irrigated and hemostasis was assured with two sutures of 3-0 Vicryl in the subcutaneous space.  The wound was then closed in two layers using 3-0 Vicryl and 4-0 Monocryl.  The incision was cleaned and sealed with DermaBond.  The patient tolerated the procedure well and all sharps were appropriately disposed of at the end of the case.  --Patient may shower tomorrow. --May take Tylenol or Ibuprofen for pain control. --Discussed activity restrictions.  --Patient is scheduled for a robotic incisional hernia repair on 08/24/21.  Will do her incision post-op at that time.   Howie Ill, MD

## 2021-08-16 NOTE — Patient Instructions (Signed)
Escisin de lesiones en la piel, cuidados posteriores Excision of Skin Lesions, Care After La siguiente informacin ofrece orientacin sobre cmo cuidarse despus del procedimiento. El mdico tambin podr darle instrucciones ms especficas. Comunquese con el mdico si tiene problemas o preguntas. Qu puedo esperar despus del procedimiento? Despus del procedimiento, es normal tener los siguientes sntomas: Inflamacin o dolor de carcter leve. Algo de enrojecimiento e hinchazn. Siga estas indicaciones en su casa: Cuidados del lugar de la escisin  Siga las indicaciones del mdico acerca de cmo cuidarse el lugar de la escisin. Asegrese de hacer lo siguiente: Lvese las manos con agua y jabn durante al menos 20 segundos antes y despus de cambiarse la venda (vendaje). Use desinfectante para manos si no dispone de [place] y [place]. Cambie el vendaje como se lo haya indicado el mdico. No retire los puntos (suturas), la goma para cerrar la piel o las tiras [place]. Es posible que estos cierres cutneos deban quedar puestos en la piel durante 2 semanas o ms tiempo. Si los bordes de las tiras [address] empiezan a despegarse y Scientific laboratory technician, puede recortar los que estn sueltos. No retire las tiras [employer] por completo a menos que el mdico se lo indique. Controle Immunologist de la escisin todos los das para detectar signos de infeccin. Est atento a lo siguiente: Aumento del enrojecimiento, la hinchazn o Chief Technology Officer. Lquido o sangre. [place]. Pus o mal olor. Mantenga el lugar limpio, seco y protegido durante al menos 48 horas. Para el sangrado, presione la zona durante 20 minutos de forma suave pero firme con una toalla doblada. No tome baos de inmersin, no nade ni use el jacuzzi hasta que el mdico lo autorice. Pregntele al mdico si puede ducharse. Delle Reining solo le permitan darse baos de [place]. Indicaciones generales Use los medicamentos de venta libre y los recetados solamente como se  lo haya indicado el mdico. Siga las indicaciones del mdico acerca de cmo reducir al mnimo la formacin de cicatrices. Las cicatrices deben reducirse con el transcurso del [place]. Evite la exposicin al sol hasta que la zona haya cicatrizado. Use pantalla solar para proteger la zona del sol despus de que haya cicatrizado. Evite las actividades y los ejercicios de alto impacto hasta que le quiten las suturas o hasta que la zona cicatrice. Concurra a todas las visitas de seguimiento. Esto es importante. Comunquese con un mdico si: Tiene ms enrojecimiento, hinchazn o dolor alrededor del lugar de la escisin. Le sale lquido o sangre del Environmental consultant de la escisin. El lugar de la escisin se siente caliente al tacto. Tiene pus o percibe que sale mal olor del lugar de la escisin. Tiene fiebre. Siente dolor que no mejora en el trmino de 2 o 3 das despus del procedimiento. Solicite ayuda de inmediato si: Tiene sangrado que no se detiene con la presin o un vendaje. La herida se abre. Resumen Use los medicamentos de venta libre y los recetados solamente como se lo haya indicado el mdico. Cambie el vendaje como se lo haya indicado el mdico. Comunquese con el mdico si tiene enrojecimiento, hinchazn, dolor u otros signos de infeccin alrededor del Environmental consultant de la escisin. Concurra a todas las visitas de seguimiento. Esto es importante. Esta informacin no tiene Theme park manager el consejo del mdico. Asegrese de hacerle al mdico cualquier pregunta que tenga. Document Revised: 09/18/2020 Document Reviewed: 09/18/2020 Elsevier Patient Education  2023 [employer].

## 2021-08-24 ENCOUNTER — Encounter: Payer: Self-pay | Admitting: Surgery

## 2021-08-24 ENCOUNTER — Ambulatory Visit: Payer: Self-pay | Admitting: General Practice

## 2021-08-24 ENCOUNTER — Encounter
Admission: RE | Disposition: A | Discharge status: Home / Self Care | Payer: Self-pay | Source: Home / Self Care | Attending: Surgery

## 2021-08-24 ENCOUNTER — Other Ambulatory Visit: Payer: Self-pay

## 2021-08-24 ENCOUNTER — Ambulatory Visit
Admission: RE | Admit: 2021-08-24 | Discharge: 2021-08-24 | Disposition: A | Discharge status: Home / Self Care | Payer: Self-pay | Attending: Surgery | Admitting: Surgery

## 2021-08-24 DIAGNOSIS — Z6836 Body mass index (BMI) 36.0-36.9, adult: Secondary | ICD-10-CM | POA: Insufficient documentation

## 2021-08-24 DIAGNOSIS — K432 Incisional hernia without obstruction or gangrene: Secondary | ICD-10-CM

## 2021-08-24 DIAGNOSIS — E669 Obesity, unspecified: Secondary | ICD-10-CM | POA: Insufficient documentation

## 2021-08-24 DIAGNOSIS — E039 Hypothyroidism, unspecified: Secondary | ICD-10-CM | POA: Insufficient documentation

## 2021-08-24 HISTORY — PX: INSERTION OF MESH: SHX5868

## 2021-08-24 SURGERY — REPAIR, HERNIA, UMBILICAL, ROBOT-ASSISTED
Anesthesia: General

## 2021-08-24 MED ORDER — ACETAMINOPHEN 500 MG PO TABS
ORAL_TABLET | ORAL | Status: AC
  Administered 2021-08-24: 1000 mg via ORAL
  Filled 2021-08-24: qty 2

## 2021-08-24 MED ORDER — LACTATED RINGERS IV SOLN: INTRAVENOUS | Status: DC

## 2021-08-24 MED ORDER — ACETAMINOPHEN 500 MG PO TABS: 1000.0000 mg | ORAL_TABLET | Freq: Four times a day (QID) | ORAL | Status: AC | PRN

## 2021-08-24 MED ORDER — GABAPENTIN 300 MG PO CAPS
ORAL_CAPSULE | ORAL | Status: AC
  Administered 2021-08-24: 300 mg via ORAL
  Filled 2021-08-24: qty 1

## 2021-08-24 MED ORDER — FAMOTIDINE 20 MG PO TABS: 20.0000 mg | ORAL_TABLET | Freq: Once | ORAL | Status: AC

## 2021-08-24 MED ORDER — CEFAZOLIN SODIUM-DEXTROSE 2-4 GM/100ML-% IV SOLN
2.0000 g | INTRAVENOUS | Status: AC
  Administered 2021-08-24: 2 g via INTRAVENOUS

## 2021-08-24 MED ORDER — OXYCODONE HCL 5 MG PO TABS
5.0000 mg | ORAL_TABLET | Freq: Once | ORAL | Status: AC | PRN
  Administered 2021-08-24: 5 mg via ORAL

## 2021-08-24 MED ORDER — BUPIVACAINE LIPOSOME 1.3 % IJ SUSP
INTRAMUSCULAR | Status: DC | PRN
  Administered 2021-08-24: 20 mL

## 2021-08-24 MED ORDER — MIDAZOLAM HCL 2 MG/2ML IJ SOLN
INTRAMUSCULAR | Status: AC
  Filled 2021-08-24: qty 2

## 2021-08-24 MED ORDER — PROPOFOL 10 MG/ML IV BOLUS
INTRAVENOUS | Status: AC
  Filled 2021-08-24: qty 20

## 2021-08-24 MED ORDER — PROPOFOL 10 MG/ML IV BOLUS
INTRAVENOUS | Status: DC | PRN
  Administered 2021-08-24: 50 mg via INTRAVENOUS
  Administered 2021-08-24: 150 mg via INTRAVENOUS

## 2021-08-24 MED ORDER — ROCURONIUM BROMIDE 100 MG/10ML IV SOLN
INTRAVENOUS | Status: DC | PRN
  Administered 2021-08-24: 60 mg via INTRAVENOUS

## 2021-08-24 MED ORDER — BUPIVACAINE-EPINEPHRINE 0.5% -1:200000 IJ SOLN
INTRAMUSCULAR | Status: DC | PRN
  Administered 2021-08-24: 30 mL

## 2021-08-24 MED ORDER — CHLORHEXIDINE GLUCONATE 0.12 % MT SOLN: 15.0000 mL | Freq: Once | OROMUCOSAL | Status: AC

## 2021-08-24 MED ORDER — OXYCODONE HCL 5 MG PO TABS: 5.0000 mg | ORAL_TABLET | ORAL | 0 refills | Status: DC | PRN

## 2021-08-24 MED ORDER — SUGAMMADEX SODIUM 200 MG/2ML IV SOLN
INTRAVENOUS | Status: DC | PRN
  Administered 2021-08-24: 50 mg via INTRAVENOUS
  Administered 2021-08-24: 150 mg via INTRAVENOUS

## 2021-08-24 MED ORDER — ONDANSETRON HCL 4 MG/2ML IJ SOLN
INTRAMUSCULAR | Status: AC
  Filled 2021-08-24: qty 2

## 2021-08-24 MED ORDER — ONDANSETRON HCL 4 MG/2ML IJ SOLN
INTRAMUSCULAR | Status: DC | PRN
  Administered 2021-08-24: 4 mg via INTRAVENOUS

## 2021-08-24 MED ORDER — MIDAZOLAM HCL 2 MG/2ML IJ SOLN
INTRAMUSCULAR | Status: DC | PRN
  Administered 2021-08-24: 2 mg via INTRAVENOUS

## 2021-08-24 MED ORDER — FENTANYL CITRATE (PF) 100 MCG/2ML IJ SOLN: 25.0000 ug | INTRAMUSCULAR | Status: DC | PRN

## 2021-08-24 MED ORDER — LIDOCAINE HCL (CARDIAC) PF 100 MG/5ML IV SOSY
PREFILLED_SYRINGE | INTRAVENOUS | Status: DC | PRN
  Administered 2021-08-24: 100 mg via INTRAVENOUS

## 2021-08-24 MED ORDER — FAMOTIDINE 20 MG PO TABS
ORAL_TABLET | ORAL | Status: AC
  Administered 2021-08-24: 20 mg via ORAL
  Filled 2021-08-24: qty 1

## 2021-08-24 MED ORDER — OXYCODONE HCL 5 MG/5ML PO SOLN: 5.0000 mg | Freq: Once | ORAL | Status: AC | PRN

## 2021-08-24 MED ORDER — BUPIVACAINE LIPOSOME 1.3 % IJ SUSP: 20.0000 mL | Freq: Once | INTRAMUSCULAR | Status: DC

## 2021-08-24 MED ORDER — PHENYLEPHRINE HCL (PRESSORS) 10 MG/ML IV SOLN
INTRAVENOUS | Status: DC | PRN
  Administered 2021-08-24 (×2): 80 ug via INTRAVENOUS
  Administered 2021-08-24: 160 ug via INTRAVENOUS

## 2021-08-24 MED ORDER — CHLORHEXIDINE GLUCONATE CLOTH 2 % EX PADS
6.0000 | MEDICATED_PAD | Freq: Once | CUTANEOUS | Status: AC
  Administered 2021-08-24: 6 via TOPICAL

## 2021-08-24 MED ORDER — ACETAMINOPHEN 500 MG PO TABS: 1000.0000 mg | ORAL_TABLET | ORAL | Status: AC

## 2021-08-24 MED ORDER — CHLORHEXIDINE GLUCONATE 0.12 % MT SOLN
OROMUCOSAL | Status: AC
  Administered 2021-08-24: 15 mL via OROMUCOSAL
  Filled 2021-08-24: qty 15

## 2021-08-24 MED ORDER — BUPIVACAINE LIPOSOME 1.3 % IJ SUSP
INTRAMUSCULAR | Status: AC
  Filled 2021-08-24: qty 20

## 2021-08-24 MED ORDER — DEXAMETHASONE SODIUM PHOSPHATE 10 MG/ML IJ SOLN
INTRAMUSCULAR | Status: AC
  Filled 2021-08-24: qty 1

## 2021-08-24 MED ORDER — BUPIVACAINE-EPINEPHRINE (PF) 0.5% -1:200000 IJ SOLN
INTRAMUSCULAR | Status: AC
  Filled 2021-08-24: qty 30

## 2021-08-24 MED ORDER — CEFAZOLIN SODIUM-DEXTROSE 2-4 GM/100ML-% IV SOLN
INTRAVENOUS | Status: AC
  Filled 2021-08-24: qty 100

## 2021-08-24 MED ORDER — FENTANYL CITRATE (PF) 100 MCG/2ML IJ SOLN
INTRAMUSCULAR | Status: AC
  Filled 2021-08-24: qty 2

## 2021-08-24 MED ORDER — IBUPROFEN 800 MG PO TABS: 800.0000 mg | ORAL_TABLET | Freq: Three times a day (TID) | ORAL | 1 refills | Status: AC | PRN

## 2021-08-24 MED ORDER — ORAL CARE MOUTH RINSE: 15.0000 mL | Freq: Once | OROMUCOSAL | Status: AC

## 2021-08-24 MED ORDER — OXYCODONE HCL 5 MG PO TABS
ORAL_TABLET | ORAL | Status: AC
  Filled 2021-08-24: qty 1

## 2021-08-24 MED ORDER — DEXAMETHASONE SODIUM PHOSPHATE 10 MG/ML IJ SOLN
INTRAMUSCULAR | Status: DC | PRN
  Administered 2021-08-24: 6 mg via INTRAVENOUS

## 2021-08-24 MED ORDER — GABAPENTIN 300 MG PO CAPS: 300.0000 mg | ORAL_CAPSULE | ORAL | Status: AC

## 2021-08-24 MED ORDER — KETOROLAC TROMETHAMINE 30 MG/ML IJ SOLN
INTRAMUSCULAR | Status: DC | PRN
  Administered 2021-08-24: 30 mg via INTRAVENOUS

## 2021-08-24 MED ORDER — CHLORHEXIDINE GLUCONATE CLOTH 2 % EX PADS: 6.0000 | MEDICATED_PAD | Freq: Once | CUTANEOUS | Status: DC

## 2021-08-24 MED ORDER — FENTANYL CITRATE (PF) 100 MCG/2ML IJ SOLN
INTRAMUSCULAR | Status: DC | PRN
  Administered 2021-08-24: 50 ug via INTRAVENOUS

## 2021-08-24 SURGICAL SUPPLY — 59 items
BLADE SURG SZ11 CARB STEEL (BLADE) ×3 IMPLANT
CANNULA REDUC XI 12-8 STAPL (CANNULA) ×1
CANNULA REDUCER 12-8 DVNC XI (CANNULA) ×2 IMPLANT
COVER TIP SHEARS 8 DVNC (MISCELLANEOUS) ×2 IMPLANT
COVER TIP SHEARS 8MM DA VINCI (MISCELLANEOUS) ×1
COVER WAND RF STERILE (DRAPES) ×3 IMPLANT
DERMABOND ADVANCED (GAUZE/BANDAGES/DRESSINGS) ×1
DERMABOND ADVANCED .7 DNX12 (GAUZE/BANDAGES/DRESSINGS) ×2 IMPLANT
DRAPE ARM DVNC X/XI (DISPOSABLE) ×6 IMPLANT
DRAPE COLUMN DVNC XI (DISPOSABLE) ×2 IMPLANT
DRAPE DA VINCI XI ARM (DISPOSABLE) ×3
DRAPE DA VINCI XI COLUMN (DISPOSABLE) ×1
ELECT CAUTERY BLADE TIP 2.5 (TIP) ×3
ELECT REM PT RETURN 9FT ADLT (ELECTROSURGICAL) ×3
ELECTRODE CAUTERY BLDE TIP 2.5 (TIP) ×2 IMPLANT
ELECTRODE REM PT RTRN 9FT ADLT (ELECTROSURGICAL) ×2 IMPLANT
GLOVE SURG SYN 7.0 (GLOVE) ×18 IMPLANT
GLOVE SURG SYN 7.0 PF PI (GLOVE) ×4 IMPLANT
GLOVE SURG SYN 7.5  E (GLOVE) ×6
GLOVE SURG SYN 7.5 E (GLOVE) ×12 IMPLANT
GLOVE SURG SYN 7.5 PF PI (GLOVE) ×4 IMPLANT
GOWN STRL REUS W/ TWL LRG LVL3 (GOWN DISPOSABLE) ×6 IMPLANT
GOWN STRL REUS W/TWL LRG LVL3 (GOWN DISPOSABLE) ×7
GRASPER SUT TROCAR 14GX15 (MISCELLANEOUS) ×3 IMPLANT
IRRIGATION STRYKERFLOW (MISCELLANEOUS) IMPLANT
IRRIGATOR STRYKERFLOW (MISCELLANEOUS)
IV NS 1000ML (IV SOLUTION)
IV NS 1000ML BAXH (IV SOLUTION) IMPLANT
KIT PINK PAD W/HEAD ARE REST (MISCELLANEOUS) ×3
KIT PINK PAD W/HEAD ARM REST (MISCELLANEOUS) ×2 IMPLANT
LABEL OR SOLS (LABEL) ×3 IMPLANT
MANIFOLD NEPTUNE II (INSTRUMENTS) ×3 IMPLANT
MESH VENT LT ST 15CM CRL ECHO2 (Mesh General) IMPLANT
MESH VENTRALIGHT ST 4.5 ECHO (Mesh General) ×1 IMPLANT
NDL INSUFFLATION 14GA 120MM (NEEDLE) ×2 IMPLANT
NEEDLE HYPO 22GX1.5 SAFETY (NEEDLE) ×3 IMPLANT
NEEDLE INSUFFLATION 14GA 120MM (NEEDLE) ×3 IMPLANT
OBTURATOR OPTICAL STANDARD 8MM (TROCAR) ×1
OBTURATOR OPTICAL STND 8 DVNC (TROCAR) ×2
OBTURATOR OPTICALSTD 8 DVNC (TROCAR) ×2 IMPLANT
PACK LAP CHOLECYSTECTOMY (MISCELLANEOUS) ×3 IMPLANT
SEAL CANN UNIV 5-8 DVNC XI (MISCELLANEOUS) ×4 IMPLANT
SEAL XI 5MM-8MM UNIVERSAL (MISCELLANEOUS) ×2
SET TUBE SMOKE EVAC HIGH FLOW (TUBING) ×3 IMPLANT
SOLUTION ELECTROLUBE (MISCELLANEOUS) ×3 IMPLANT
SPONGE T-LAP 18X18 ~~LOC~~+RFID (SPONGE) ×3 IMPLANT
STAPLER CANNULA SEAL DVNC XI (STAPLE) ×2 IMPLANT
STAPLER CANNULA SEAL XI (STAPLE) ×1
SUT MNCRL 4-0 (SUTURE) ×1
SUT MNCRL 4-0 27XMFL (SUTURE) ×2
SUT STRATAFIX PDS 30 CT-1 (SUTURE) ×3 IMPLANT
SUT VICRYL 0 AB UR-6 (SUTURE) ×6 IMPLANT
SUT VLOC 90 2/L VL 12 GS22 (SUTURE) ×6 IMPLANT
SUTURE MNCRL 4-0 27XMF (SUTURE) ×2 IMPLANT
SYS BAG RETRIEVAL 10MM (BASKET) ×3
SYSTEM BAG RETRIEVAL 10MM (BASKET) IMPLANT
TAPE TRANSPORE STRL 2 31045 (GAUZE/BANDAGES/DRESSINGS) ×3 IMPLANT
TRAY FOLEY SLVR 16FR LF STAT (SET/KITS/TRAYS/PACK) ×2 IMPLANT
WATER STERILE IRR 500ML POUR (IV SOLUTION) ×2 IMPLANT

## 2021-08-24 NOTE — Anesthesia Procedure Notes (Signed)
Procedure Name: Intubation Date/Time: 08/24/2021 7:41 AM  Performed by: Milagros Reap, CRNAPre-anesthesia Checklist: Patient identified, Emergency Drugs available, Suction available, Patient being monitored and Timeout performed Patient Re-evaluated:Patient Re-evaluated prior to induction Oxygen Delivery Method: Circle system utilized Preoxygenation: Pre-oxygenation with 100% oxygen Induction Type: IV induction Ventilation: Mask ventilation without difficulty Laryngoscope Size: McGraph and 3 Grade View: Grade I Tube type: Oral Tube size: 7.5 mm Number of attempts: 1 Airway Equipment and Method: Stylet Placement Confirmation: positive ETCO2, breath sounds checked- equal and bilateral and ETT inserted through vocal cords under direct vision Secured at: 22 cm Tube secured with: Tape Dental Injury: Teeth and Oropharynx as per pre-operative assessment

## 2021-08-24 NOTE — Anesthesia Preprocedure Evaluation (Signed)
Anesthesia Evaluation  Patient identified by MRN, date of birth, ID band Patient awake    Reviewed: Allergy & Precautions, NPO status , Patient's Chart, lab work & pertinent test results  History of Anesthesia Complications Negative for: history of anesthetic complications  Airway Mallampati: IV  TM Distance: >3 FB Neck ROM: Full    Dental no notable dental hx. (+) Teeth Intact   Pulmonary neg pulmonary ROS, neg sleep apnea, neg COPD,    Pulmonary exam normal breath sounds clear to auscultation- rhonchi (-) wheezing      Cardiovascular Exercise Tolerance: Good (-) hypertension(-) CAD and (-) Past MI negative cardio ROS Normal cardiovascular exam Rhythm:Regular Rate:Normal - Systolic murmurs and - Diastolic murmurs    Neuro/Psych negative neurological ROS  negative psych ROS   GI/Hepatic negative GI ROS, Neg liver ROS,   Endo/Other  neg diabetesHypothyroidism   Renal/GU negative Renal ROS     Musculoskeletal negative musculoskeletal ROS (+)   Abdominal (+) + obese,   Peds  Hematology negative hematology ROS (+)   Anesthesia Other Findings Past Medical History: 11/12/2019: Appendicitis No date: Hypothyroidism No date: Pre-diabetes No date: Skin tag     Comment:  right lower back  Past Surgical History: 11/12/2019: LAPAROSCOPIC APPENDECTOMY; N/A     Comment:  Procedure: APPENDECTOMY LAPAROSCOPIC;  Surgeon: Henrene Dodge, MD;  Location: [facility] ORS;  Service: General;                Laterality: N/A; No date: TUBAL LIGATION 2003: VARICOSE VEIN SURGERY; Right  BMI    Body Mass Index: 36.69 kg/m      Reproductive/Obstetrics negative OB ROS                             Anesthesia Physical  Anesthesia Plan  ASA: 2 and emergent  Anesthesia Plan: General/Spinal   Post-op Pain Management:    Induction: Intravenous  PONV Risk Score and Plan: 4 or greater and  Ondansetron, Dexamethasone and Midazolam  Airway Management Planned: Oral ETT  Additional Equipment:   Intra-op Plan:   Post-operative Plan: Extubation in OR  Informed Consent: I have reviewed the patients History and Physical, chart, labs and discussed the procedure including the risks, benefits and alternatives for the proposed anesthesia with the patient or authorized representative who has indicated his/her understanding and acceptance.     Dental advisory given  Plan Discussed with: CRNA and Anesthesiologist  Anesthesia Plan Comments: (Patient consented for risks of anesthesia including but not limited to:  - adverse reactions to medications - damage to eyes, teeth, lips or other oral mucosa - nerve damage due to positioning  - sore throat or hoarseness - Damage to heart, brain, nerves, lungs, other parts of body or loss of life  Patient voiced understanding.)        Anesthesia Quick Evaluation

## 2021-08-24 NOTE — Interval H&P Note (Signed)
History and Physical Interval Note:  08/24/2021 7:13 AM  Gabriela Lawrence  has presented today for surgery, with the diagnosis of Recurrent incisional hernia.  The various methods of treatment have been discussed with the patient and family. After consideration of risks, benefits and other options for treatment, the patient has consented to  Procedure(s): XI ROBOT ASSISTED UMBILICAL HERNIA REPAIR (N/A) as a surgical intervention.  The patient's history has been reviewed, patient examined, no change in status, stable for surgery.  I have reviewed the patient's chart and labs.  Questions were answered to the patient's satisfaction.     [name]

## 2021-08-24 NOTE — Discharge Instructions (Signed)
AMBULATORY SURGERY  ?DISCHARGE INSTRUCTIONS ? ? ?The drugs that you were given will stay in your system until tomorrow so for the next 24 hours you should not: ? ?Drive an automobile ?Make any legal decisions ?Drink any alcoholic beverage ? ? ?You may resume regular meals tomorrow.  Today it is better to start with liquids and gradually work up to solid foods. ? ?You may eat anything you prefer, but it is better to start with liquids, then soup and crackers, and gradually work up to solid foods. ? ? ?Please notify your doctor immediately if you have any unusual bleeding, trouble breathing, redness and pain at the surgery site, drainage, fever, or pain not relieved by medication. ? ? ? ?Additional Instructions: ? ? ? ?Please contact your physician with any problems or Same Day Surgery at [phone], Monday through Friday 6 am to 4 pm, or [place] at [place] Main number at [phone].  ?

## 2021-08-24 NOTE — Anesthesia Postprocedure Evaluation (Signed)
Anesthesia Post Note  Patient: Jalexa Delia Rivera-Reyes  Procedure(s) Performed: XI ROBOT ASSISTED UMBILICAL HERNIA REPAIR INSERTION OF MESH  Patient location during evaluation: PACU Anesthesia Type: Combined General/Spinal Level of consciousness: awake and alert Pain management: pain level controlled Vital Signs Assessment: post-procedure vital signs reviewed and stable Respiratory status: spontaneous breathing, nonlabored ventilation, respiratory function stable and patient connected to nasal cannula oxygen Cardiovascular status: blood pressure returned to baseline and stable Postop Assessment: no apparent nausea or vomiting Anesthetic complications: no   No notable events documented.   Last Vitals:  Vitals:   08/24/21 1042 08/24/21 1152  BP: 117/71 118/73  Pulse: 69 66  Resp: 16 16  Temp: (!) 36.1 C   SpO2: 95% 96%    Last Pain:  Vitals:   08/24/21 1152  TempSrc:   PainSc: 0-No pain                 Stephanie Coup

## 2021-08-24 NOTE — Transfer of Care (Signed)
Immediate Anesthesia Transfer of Care Note  Patient: Gabriela Lawrence  Procedure(s) Performed: XI ROBOT ASSISTED UMBILICAL HERNIA REPAIR INSERTION OF MESH  Patient Location: PACU  Anesthesia Type:General  Level of Consciousness: awake and drowsy  Airway & Oxygen Therapy: Patient Spontanous Breathing and Patient connected to face mask oxygen  Post-op Assessment: Report given to RN and Post -op Vital signs reviewed and stable  Post vital signs: Reviewed and stable  Last Vitals:  Vitals Value Taken Time  BP 128/83 08/24/21 0955  Temp 36.3 C 08/24/21 0955  Pulse 79 08/24/21 0958  Resp 16 08/24/21 0958  SpO2 100 % 08/24/21 0958  Vitals shown include unvalidated device data.  Last Pain:  Vitals:   08/24/21 0637  TempSrc: Temporal  PainSc: 0-No pain         Complications: No notable events documented.

## 2021-08-24 NOTE — Op Note (Signed)
Procedure Date:  08/24/2021  Pre-operative Diagnosis:  Recurrent incisional umbilical hernia  Post-operative Diagnosis: Recurrent incisional umbilical hernia, total defect size 2.8 cm.  Procedure:  Robotic assisted Recurrent Incisional Hernia Repair with mesh  Surgeon:  Howie Ill, MD  Anesthesia:  General endotracheal  Estimated Blood Loss:  10 ml  Specimens:  None  Complications:  None  Indications for Procedure:  This is a 56 y.o. female who presents with a recurrent incisional umbilical hernia.  The options of surgery versus observation were reviewed with the patient and/or family. The risks of bleeding, abscess or infection, recurrence of symptoms, potential for an open procedure, injury to surrounding structures, and chronic pain were all discussed with the patient and was willing to proceed.  Description of Procedure: The patient was correctly identified in the preoperative area and brought into the operating room.  The patient was placed supine with VTE prophylaxis in place.  Appropriate time-outs were performed.  Anesthesia was induced and the patient was intubated.  Appropriate antibiotics were infused.  The abdomen was prepped and draped in a sterile fashion. The patient's hernia defect was marked with a marking pen.  A Veress needle was introduced in the left upper quadrant and pneumoperitoneum was obtained with appropriate pressures.  Using Optiview technique, an 8 mm port was introduced in the left lateral abdominal wall without complications.  Then, a 12 mm port was introduced in the left upper quadrant and an 8 mm port in the left lower quadrant under direct visualization.  A 4 inch circular Bard Ventralight ST Echo mesh, a 0 Stratafix suture, and two 2-0 V-loc sutures were inserted through the 12 mm port under direct visualization.  The DaVinci platform was docked, camera targeted, and instruments placed under direct visualization.  The patient's hernia was fully  reduced and the peritoneum and preperitoneal fat were dissected and resected to allow better exposure of the hernia defect and for better mesh placement. The patient had a main hernia at prior incision, and a much smaller hernia at the base of the umbilicus, adjacent to each other.  Total defect length was 2.8 cm.  The hernia defect was closed using the stratafix suture.  A PMI was brought through the center of the hernia defect and the positioning system of the mesh was passed through and insufflated.  This allowed the mesh to splay open and be centered over the repair site with good overlap.  The mesh was then sutured in place circumferentially and through the center of the mesh using the V-loc sutures.  All needles and the positioning system were then removed through the 12 mm port without complications.  The DaVinci platform was then undocked and instruments removed.  The preperitoneal fat was placed in an Endocatch bag.  50 ml of Exparel solution mixed with 0.5% bupivacaine with epi was infiltrated around the mesh edges, hernia repair site, and port sites.  The 12 mm port was removed and the bag retrieved.  The fascia was closed under direct visualization utilizing an Endo Close technique with 0 Vicryl suture.  The 8 mm ports were removed. The 12 mm incision was closed using 3-0 Vicryl and 4-0 Monocryl, and the other port incisions were closed with 4-0 Monocryl.  The wounds were cleaned and sealed with DermaBond.  The patient was emerged from anesthesia and extubated and brought to the recovery room for further management.  The patient tolerated the procedure well and all counts were correct at the end of the case.  Howie Ill, MD

## 2021-08-27 ENCOUNTER — Encounter: Payer: Self-pay | Admitting: Surgery

## 2021-09-06 ENCOUNTER — Ambulatory Visit (INDEPENDENT_AMBULATORY_CARE_PROVIDER_SITE_OTHER): Payer: Self-pay | Admitting: Surgery

## 2021-09-06 ENCOUNTER — Encounter: Payer: Self-pay | Admitting: Surgery

## 2021-09-06 VITALS — BP 115/71 | HR 62 | Temp 98.0°F | Ht 61.02 in | Wt 198.0 lb

## 2021-09-06 DIAGNOSIS — L918 Other hypertrophic disorders of the skin: Secondary | ICD-10-CM

## 2021-09-06 DIAGNOSIS — K432 Incisional hernia without obstruction or gangrene: Secondary | ICD-10-CM

## 2021-09-06 DIAGNOSIS — Z09 Encounter for follow-up examination after completed treatment for conditions other than malignant neoplasm: Secondary | ICD-10-CM

## 2021-09-06 NOTE — Progress Notes (Signed)
09/06/2021  History of Present Illness: Gabriela Lawrence is a 56 y.o. female status post robotic assisted recurrent incisional hernia repair on 08/24/2021.  Patient presents today for follow-up.  Patient reports that she is having some discomfort at the incisions particularly the left upper quadrant and at the hernia site.  Otherwise she has been doing well and improving overall.  Initially she had more pain that was thought to be related to gas from the surgery but this has since resolved.  Denies any troubles with the incisions themselves but reports that the left upper quadrant incision seems a little more swollen.  Past Medical History: Past Medical History:  Diagnosis Date   Appendicitis 11/12/2019   Hypothyroidism    Pre-diabetes    Skin tag    right lower back     Past Surgical History: Past Surgical History:  Procedure Laterality Date   INSERTION OF MESH  08/24/2021   Procedure: INSERTION OF MESH;  Surgeon: Henrene Dodge, MD;  Location: [facility] ORS;  Service: General;;   LAPAROSCOPIC APPENDECTOMY N/A 11/12/2019   Procedure: APPENDECTOMY LAPAROSCOPIC;  Surgeon: Henrene Dodge, MD;  Location: [facility] OR[facility];  Service: General;  Laterality: N/A;   TUBAL LIGATION     VARICOSE VEIN SURGERY Right 2003    Home Medications: Prior to Admission medications   Medication Sig Start Date End Date Taking? Authorizing Provider  acetaminophen (TYLENOL) 500 MG tablet Take 2 tablets (1,000 mg total) by mouth every 6 (six) hours as needed for mild pain. 08/24/21  Yes Ahmya Bernick, Elita Quick, MD  Cholecalciferol (VITAMIN D3) 30 MCG/15ML LIQD Take by mouth.   Yes [provider]  Cyanocobalamin (VITAMIN B 12 PO) Take by mouth.   Yes [provider]  ibuprofen (ADVIL) 800 MG tablet Take 1 tablet (800 mg total) by mouth every 8 (eight) hours as needed for moderate pain. 08/24/21  Yes Dynesha Woolen, Elita Quick, MD  levothyroxine (SYNTHROID) 25 MCG tablet Take 25 mcg by mouth daily. 04/21/21  Yes [provider]  magnesium 30 MG tablet Take 30 mg by mouth 2 (two) times daily.   Yes [provider]    Allergies: No Known Allergies  Review of Systems: Review of Systems  Constitutional:  Negative for chills and fever.  Respiratory:  Negative for shortness of breath.   Cardiovascular:  Negative for chest pain.  Gastrointestinal:  Positive for abdominal pain. Negative for nausea and vomiting.  Skin:  Negative for rash.    Physical Exam BP 115/71   Pulse 62   Temp 98 F (36.7 C)   Ht 5' 1.02" (1.55 m)   Wt 198 lb (89.8 kg)   LMP 05/31/2016 (Approximate)   SpO2 98%   BMI 37.39 kg/m  CONSTITUTIONAL: No acute distress HEENT:  Normocephalic, atraumatic, extraocular motion intact. RESPIRATORY:  Normal respiratory effort without pathologic use of accessory muscles. CARDIOVASCULAR: Regular rhythm and rate. GI: The abdomen is soft, nondistended, appropriately sore or tender to palpation.  Left-sided incisions are healing well and are clean, dry, intact.  There is a little more firmness at the left upper quadrant incision which is the largest of the 3 which is consistent with scar tissue and the inflammation from the deep suture closure.  No evidence of hernia recurrence at the midline.  SKIN: Lower back incision from her skin tag excision on 08/16/2021 is healing well without any evidence of infection. NEUROLOGIC:  Motor and sensation is grossly normal.  Cranial nerves are grossly intact. PSYCH:  Alert and oriented to person,  place and time. Affect is normal.   Assessment and Plan: This is a 56 y.o. female status post robotic assisted recurrent incisional hernia repair on 08/24/2021.  - Patient is healing well from her hernia repair and discussed with the patient that the area of firmness and swelling in the left upper quadrant related to the larger incision and the deeper suture used for closure.  This will continue to improve as the wound heals and the inflammation  subsides. - Reminded the patient still of activity restrictions of no heavy lifting or pushing of no more than 15 pounds for period of 4 to 6 weeks. - Follow-up as needed.  I spent 20 minutes dedicated to the care of this patient on the date of this encounter to include pre-visit review of records, face-to-face time with the patient discussing diagnosis and management, and any post-visit coordination of care.   Howie Ill, MD [facility]

## 2021-09-06 NOTE — Patient Instructions (Signed)
no levante ms de 20 libras durante 6 semanas despus de la [place].    GENERAL POST-OPERATIVE PATIENT INSTRUCTIONS   WOUND CARE INSTRUCTIONS:  If the wound becomes bright red and painful or starts to drain infected material that is not clear, please contact your physician immediately.  If the wound is mildly pink and has a thick firm ridge underneath it, this is normal, and is referred to as a healing ridge.  This will resolve over the next 4-6 weeks.  BATHING: You may shower if you have been informed of this by your surgeon. However, Please do not submerge in a tub, hot tub, or pool until incisions are completely sealed or have been told by your surgeon that you may do so.  DIET:  You may eat any foods that you can tolerate.  It is a good idea to eat a high fiber diet and take in plenty of fluids to prevent constipation.  If you do become constipated you may want to take a mild laxative or take ducolax tablets on a daily basis until your bowel habits are regular.  Constipation can be very uncomfortable, along with straining, after recent surgery.  ACTIVITY: You should not lift more than 20 pounds for 6 weeks total after surgery as it could put you at increased risk for complications.  Twenty pounds is roughly equivalent to a plastic bag of groceries. At that time- Listen to your body when lifting, if you have pain when lifting, stop and then try again in a few days. Soreness after doing exercises or activities of daily living is normal as you get back in to your normal routine.  MEDICATIONS:  Try to take narcotic medications and anti-inflammatory medications, such as tylenol, ibuprofen, naprosyn, etc., with food.  This will minimize stomach upset from the medication.  Should you develop nausea and vomiting from the pain medication, or develop a rash, please discontinue the medication and contact your physician.  You should not drive, make important decisions, or operate machinery when taking  narcotic pain medication.  SUNBLOCK Use sun block to incision area over the next year if this area will be exposed to sun. This helps decrease scarring and will allow you avoid a permanent darkened area over your incision.  QUESTIONS:  Please feel free to call our office if you have any questions, and we will be glad to assist you. [phone]

## 2021-10-25 ENCOUNTER — Ambulatory Visit (LOCAL_COMMUNITY_HEALTH_CENTER): Payer: Self-pay

## 2021-10-25 DIAGNOSIS — Z7185 Encounter for immunization safety counseling: Secondary | ICD-10-CM

## 2021-10-25 NOTE — Progress Notes (Signed)
Patient seen in nurse clinic for immigration vaccinations with spouse. MMR, Varicella, Tdap and Hep B are requested.  No other vaccination records available. Patient reported she had all vaccines as a child but no documentation.  [employer] assistance applied for Hep B, MMR and Varicella.  Approval obtained.  Tdap series based on [place]. Patient is rescheduled to return on Friday for vaccinations. Rowland Lathe and Marlene [place] - interpreters.

## 2021-10-29 ENCOUNTER — Ambulatory Visit (LOCAL_COMMUNITY_HEALTH_CENTER): Payer: Self-pay

## 2021-10-29 DIAGNOSIS — Z23 Encounter for immunization: Secondary | ICD-10-CM

## 2021-10-29 DIAGNOSIS — Z719 Counseling, unspecified: Secondary | ICD-10-CM

## 2021-10-29 NOTE — Progress Notes (Addendum)
Pt in clinic for immigration vaccines requested on 10/25/21 accompanied by husband. Three vaccines (MMR, Hep B and Varicella) were approved by [employer] assistance program pulled from private supply and 1 vaccine (Tdap) meets State criteria. Pt denies chicken pox as a child. Administered 4 vaccines, tolerated well. Given VIS and updated NCIR copies, discussed and understood. Spanish interpretation was provided by Rowland Lathe. M.Viktorya Arguijo, LPN.

## 2021-11-26 ENCOUNTER — Ambulatory Visit: Payer: Self-pay

## 2021-11-26 ENCOUNTER — Ambulatory Visit (LOCAL_COMMUNITY_HEALTH_CENTER): Payer: Self-pay

## 2021-11-26 DIAGNOSIS — Z7185 Encounter for immunization safety counseling: Secondary | ICD-10-CM

## 2022-07-18 ENCOUNTER — Ambulatory Visit (LOCAL_COMMUNITY_HEALTH_CENTER): Payer: Self-pay

## 2022-07-18 ENCOUNTER — Ambulatory Visit: Payer: Self-pay

## 2022-07-18 DIAGNOSIS — Z719 Counseling, unspecified: Secondary | ICD-10-CM

## 2022-07-18 DIAGNOSIS — Z23 Encounter for immunization: Secondary | ICD-10-CM

## 2022-07-18 NOTE — Progress Notes (Signed)
In nurse clinic with husband for polio vaccine only today as needed for immigration. States she has had additional vaccines at [facility] that are not reflected in [place]. Denies having had polio vaccine. RN counseled pt to bring vaccine record to [facility] and RN will update [facility].   Polio vaccine administered today and tolerated well. Updated NCIR copy given and explained. Jerel Shepherd, RN

## 2022-12-13 IMAGING — MG MM DIGITAL SCREENING BILAT W/ TOMO AND CAD
8 series · 8 of 24 positions shown · non-contrast
Comparison: Previous exam(s).

CLINICAL DATA: Screening.

EXAM:
DIGITAL SCREENING BILATERAL MAMMOGRAM WITH TOMOSYNTHESIS AND CAD
TECHNIQUE: Bilateral screening digital craniocaudal and mediolateral oblique
mammograms were obtained. Bilateral screening digital breast
tomosynthesis was performed. The images were evaluated with
computer-aided detection.

[L MLO synth-2D]
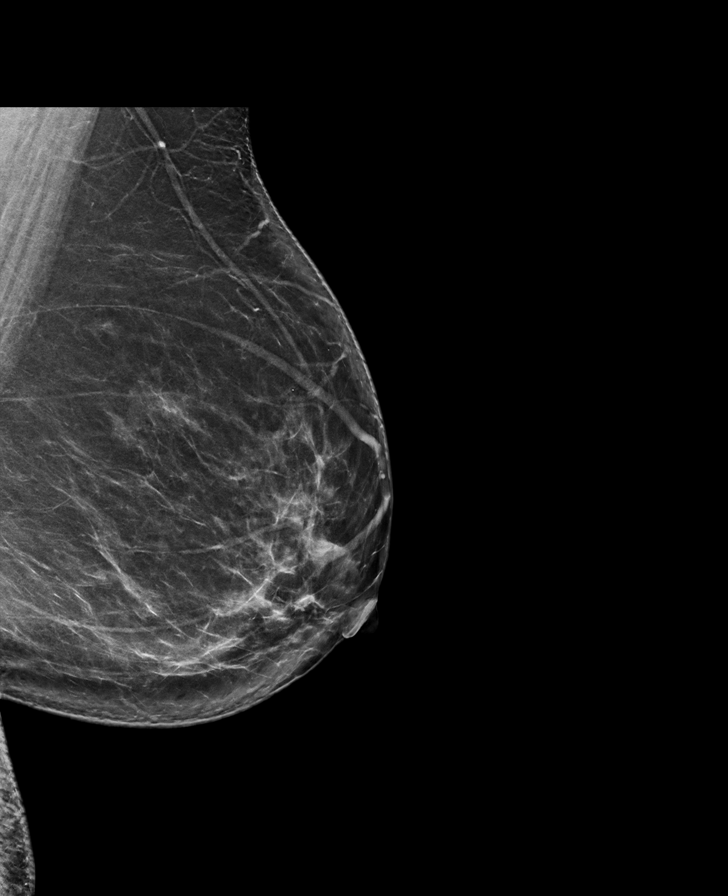

[R CC synth-2D]
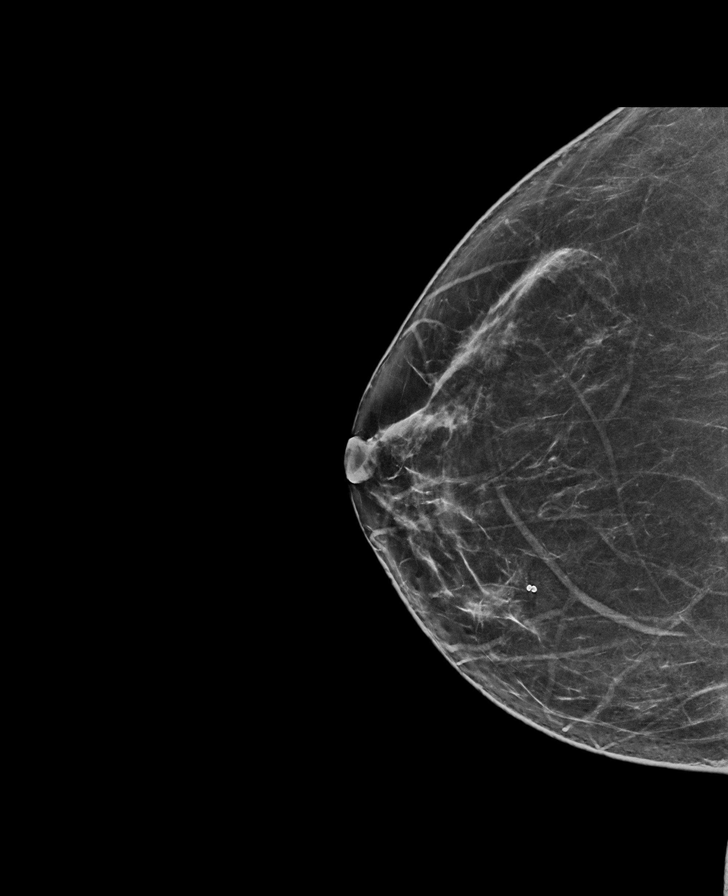

[R MLO synth-2D]
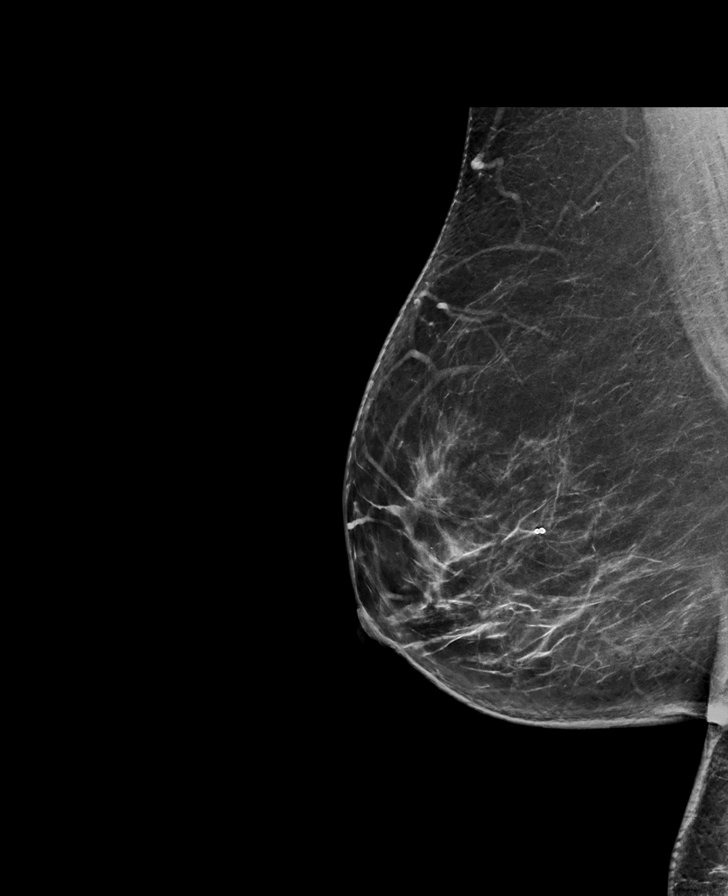

[L CC synth-2D]
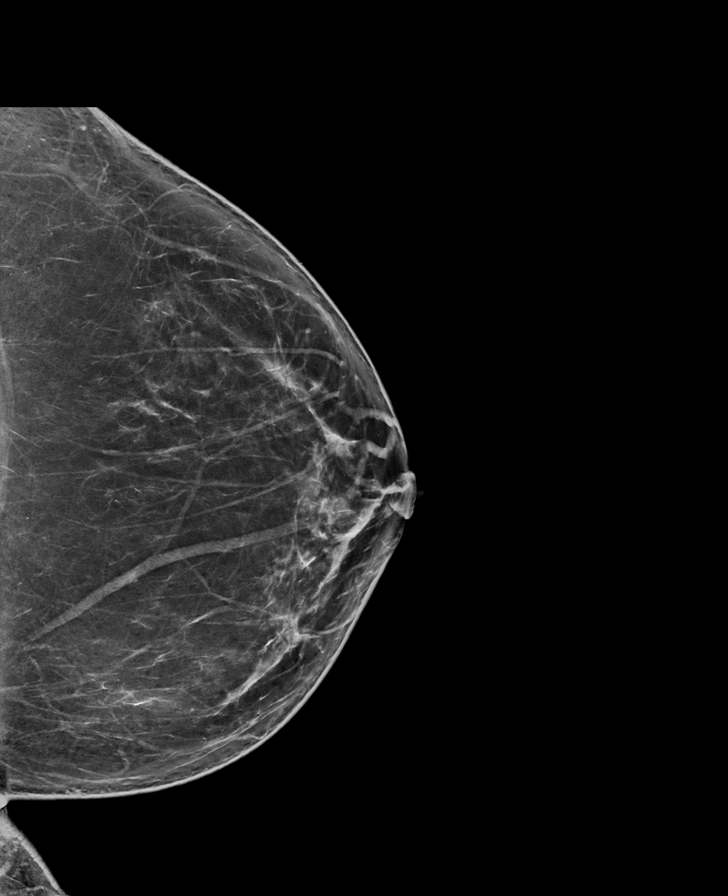

[R MLO tomo · tomo slice 42/83.0]
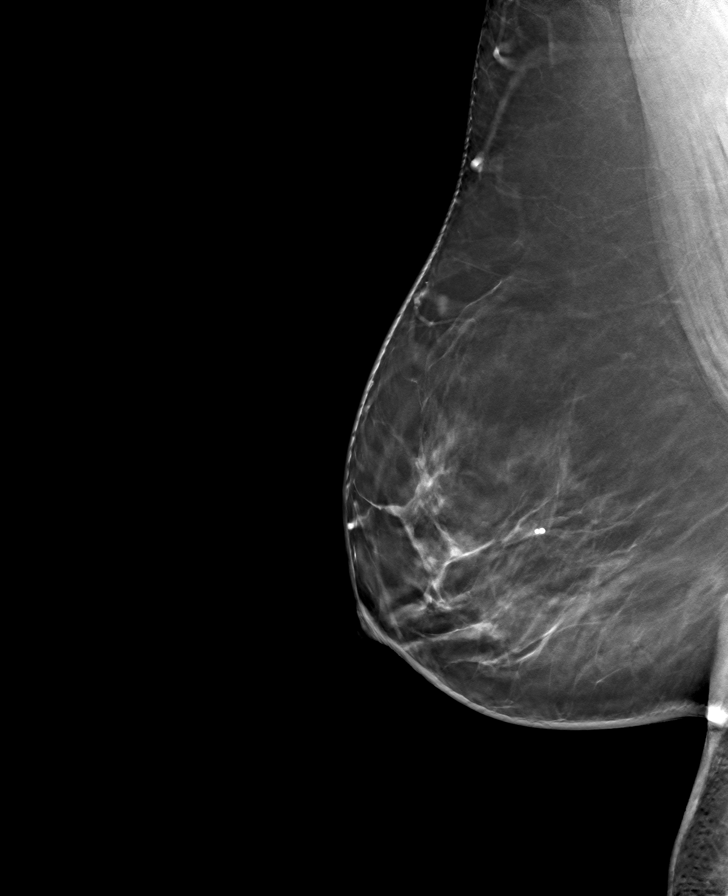

[L MLO tomo · tomo slice 41/80.0]
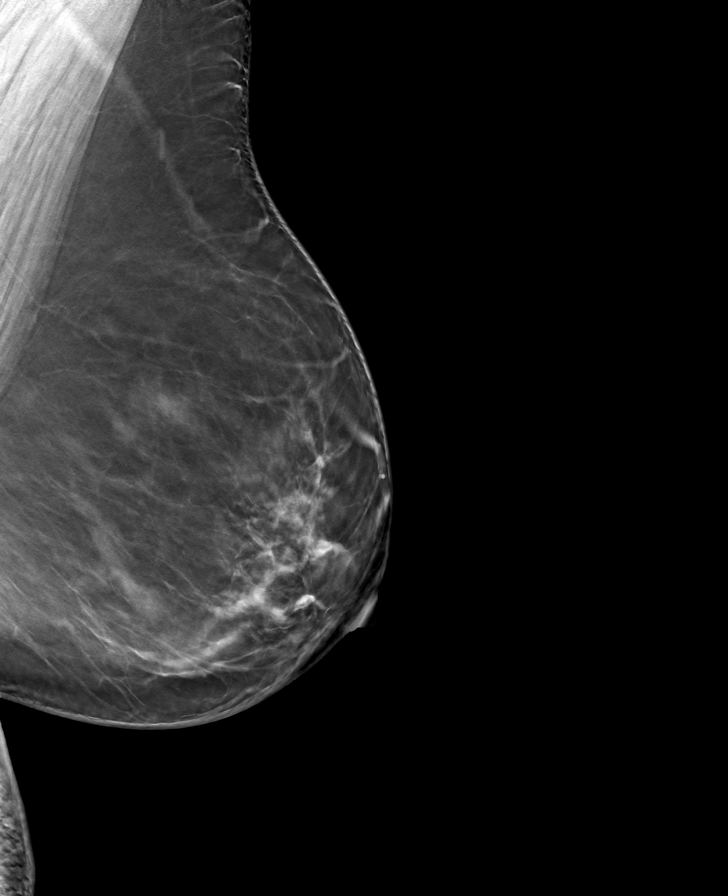

[L CC tomo · tomo slice 40/79.0]
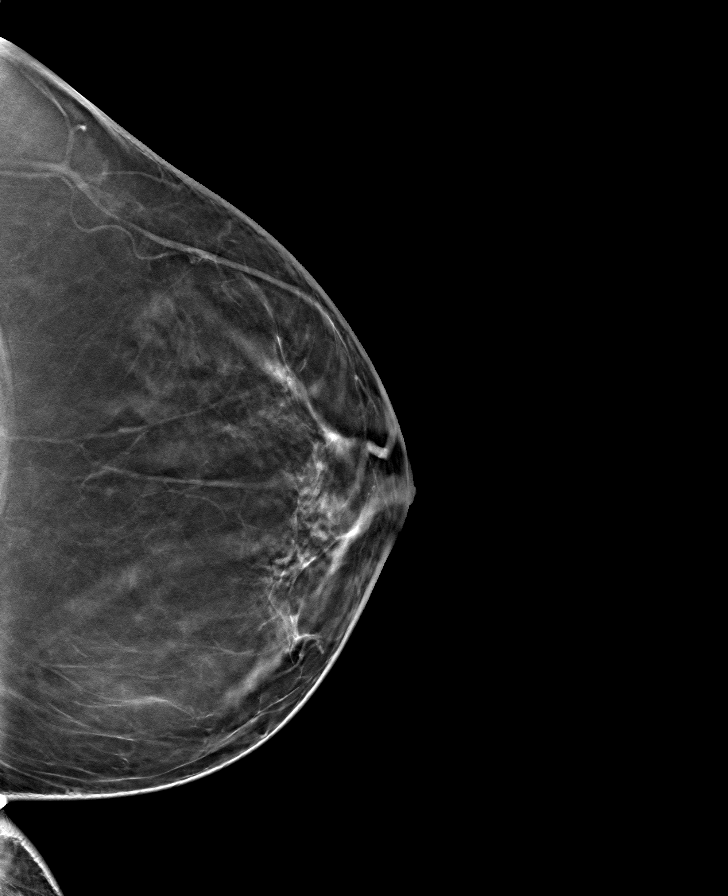

[R CC tomo · tomo slice 37/74.0]
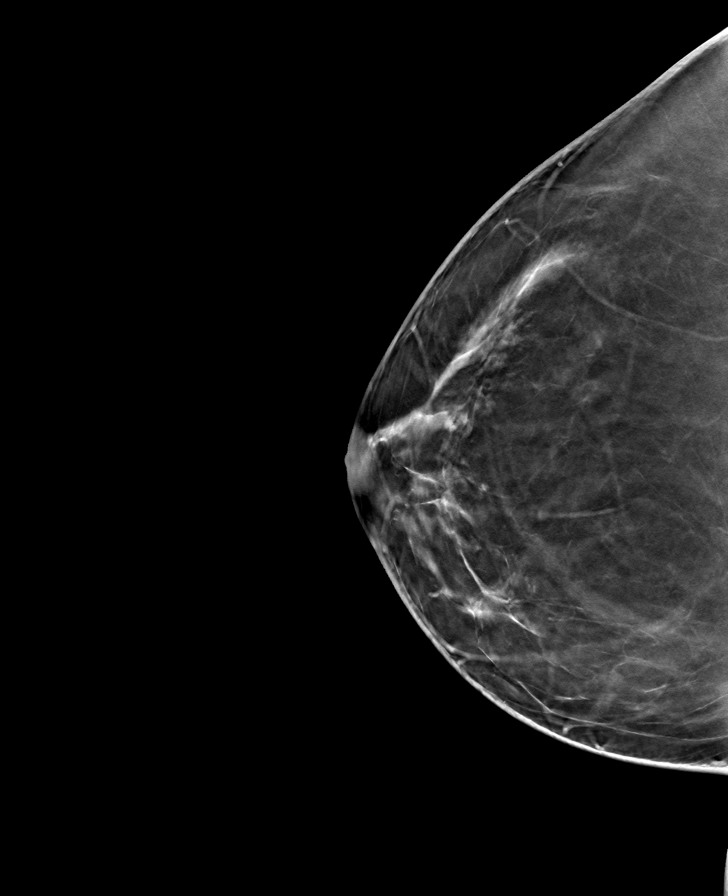

[8 of 24 positions shown; findings below may reference images not displayed]

ACR Breast Density Category b: There are scattered areas of
fibroglandular density.
FINDINGS: There are no findings suspicious for malignancy.
IMPRESSION: No mammographic evidence of malignancy. A result letter of this
screening mammogram will be mailed directly to the patient.

RECOMMENDATION:
Screening mammogram in one year. (Code:51-O-LD2)

BI-RADS CATEGORY  1: Negative.

## 2023-10-09 ENCOUNTER — Other Ambulatory Visit: Payer: Self-pay | Admitting: Primary Care

## 2023-10-09 DIAGNOSIS — Z1231 Encounter for screening mammogram for malignant neoplasm of breast: Secondary | ICD-10-CM

## 2023-10-31 ENCOUNTER — Ambulatory Visit
Admission: RE | Admit: 2023-10-31 | Discharge: 2023-10-31 | Disposition: A | Discharge status: Home / Self Care | Payer: Self-pay | Source: Ambulatory Visit | Attending: Primary Care | Admitting: Primary Care

## 2023-10-31 DIAGNOSIS — Z1231 Encounter for screening mammogram for malignant neoplasm of breast: Secondary | ICD-10-CM | POA: Insufficient documentation

## 2024-01-16 ENCOUNTER — Telehealth: Payer: Self-pay

## 2024-01-16 ENCOUNTER — Other Ambulatory Visit: Payer: Self-pay

## 2024-01-16 DIAGNOSIS — Z1211 Encounter for screening for malignant neoplasm of colon: Secondary | ICD-10-CM

## 2024-01-16 MED ORDER — NA SULFATE-K SULFATE-MG SULF 17.5-3.13-1.6 GM/177ML PO SOLN: 354.0000 mL | Freq: Once | ORAL | 0 refills | Status: DC

## 2024-01-16 MED ORDER — NA SULFATE-K SULFATE-MG SULF 17.5-3.13-1.6 GM/177ML PO SOLN: 354.0000 mL | Freq: Once | ORAL | 0 refills | Status: AC

## 2024-01-16 NOTE — Telephone Encounter (Signed)
 Gastroenterology Pre-Procedure Review  Request Date: 04/23/2024 Requesting Physician: Dr. Melany  PATIENT REVIEW QUESTIONS: The patient responded to the following health history questions as indicated:    1. Are you having any GI issues? no 2. Do you have a personal history of Polyps? no 3. Do you have a family history of Colon Cancer or Polyps? no 4. Diabetes Mellitus? no 5. Joint replacements in the past 12 months?no 6. Major health problems in the past 3 months?no 7. Any artificial heart valves, MVP, or defibrillator?no    MEDICATIONS & ALLERGIES:    Patient reports the following regarding taking any anticoagulation/antiplatelet therapy:   Plavix, Coumadin, Eliquis, Xarelto, Lovenox, Pradaxa, Brilinta, or Effient? no Aspirin? no  Patient confirms/reports the following medications:  Current Outpatient Medications  Medication Sig Dispense Refill   acetaminophen  (TYLENOL ) 500 MG tablet Take 2 tablets (1,000 mg total) by mouth every 6 (six) hours as needed for mild pain.     Cholecalciferol (VITAMIN D3) 30 MCG/15ML LIQD Take by mouth.     Cyanocobalamin (VITAMIN B 12 PO) Take by mouth.     ibuprofen  (ADVIL ) 800 MG tablet Take 1 tablet (800 mg total) by mouth every 8 (eight) hours as needed for moderate pain. 60 tablet 1   levothyroxine (SYNTHROID) 25 MCG tablet Take 25 mcg by mouth daily.     magnesium 30 MG tablet Take 30 mg by mouth 2 (two) times daily.     Na Sulfate-K Sulfate-Mg Sulfate concentrate (SUPREP) 17.5-3.13-1.6 GM/177ML SOLN Take 1 kit (354 mLs total) by mouth once for 1 dose. Starting at 5 PM take one bottle and pour into the supplied cup, add cool water to the fill 16 oz line and drink all. Then 5 hours before procedure pour the second bottle into the supplied cup, add cool water to the fill 16 oz line and drink all. 354 mL 0   No current facility-administered medications for this visit.    Patient confirms/reports the following allergies:  Allergies[1]  No orders  of the defined types were placed in this encounter.   AUTHORIZATION INFORMATION Primary Insurance: 1D#: Group #:  Secondary Insurance: 1D#: Group #:  SCHEDULE INFORMATION: Date: 04/23/2024 Time: Location: [facility] Dr. Melany     [1] No Known Allergies

## 2024-04-23 ENCOUNTER — Ambulatory Visit: Admit: 2024-04-23 | Admitting: Gastroenterology

## 2024-04-23 SURGERY — COLONOSCOPY
Anesthesia: General
